# Patient Record
Sex: Female | Born: 2005
Health system: Southern US, Community
[De-identification: ages and names within clinical notes are randomized; demographics above are authoritative.]

## PROBLEM LIST (undated history)

## (undated) DIAGNOSIS — Z87898 Personal history of other specified conditions: Secondary | ICD-10-CM

## (undated) DIAGNOSIS — J45990 Exercise induced bronchospasm: Secondary | ICD-10-CM

## (undated) DIAGNOSIS — J353 Hypertrophy of tonsils with hypertrophy of adenoids: Secondary | ICD-10-CM

---
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
## 2023-01-17 DIAGNOSIS — Z113 Encounter for screening for infections with a predominantly sexual mode of transmission: Secondary | ICD-10-CM | POA: Diagnosis not present

## 2023-01-17 DIAGNOSIS — B3731 Acute candidiasis of vulva and vagina: Secondary | ICD-10-CM | POA: Diagnosis not present

## 2023-01-28 ENCOUNTER — Other Ambulatory Visit: Payer: Self-pay | Admitting: Family

## 2023-01-28 DIAGNOSIS — F902 Attention-deficit hyperactivity disorder, combined type: Secondary | ICD-10-CM

## 2023-01-30 ENCOUNTER — Other Ambulatory Visit: Payer: Self-pay

## 2023-01-30 DIAGNOSIS — Z114 Encounter for screening for human immunodeficiency virus [HIV]: Secondary | ICD-10-CM | POA: Diagnosis not present

## 2023-01-30 DIAGNOSIS — R1033 Periumbilical pain: Secondary | ICD-10-CM | POA: Diagnosis not present

## 2023-01-30 DIAGNOSIS — Z113 Encounter for screening for infections with a predominantly sexual mode of transmission: Secondary | ICD-10-CM | POA: Diagnosis not present

## 2023-01-30 DIAGNOSIS — R350 Frequency of micturition: Secondary | ICD-10-CM | POA: Diagnosis not present

## 2023-01-30 MED ORDER — CONCERTA 27 MG PO TBCR
27.0000 mg | EXTENDED_RELEASE_TABLET | ORAL | 0 refills | Status: DC
  Filled 2023-01-30: qty 30, 30d supply, fill #0

## 2023-02-03 ENCOUNTER — Other Ambulatory Visit: Payer: Self-pay

## 2023-02-17 DIAGNOSIS — N898 Other specified noninflammatory disorders of vagina: Secondary | ICD-10-CM | POA: Diagnosis not present

## 2023-03-03 ENCOUNTER — Ambulatory Visit (INDEPENDENT_AMBULATORY_CARE_PROVIDER_SITE_OTHER): Payer: Medicaid Other | Admitting: Family

## 2023-03-03 ENCOUNTER — Other Ambulatory Visit: Payer: Self-pay

## 2023-03-03 ENCOUNTER — Encounter: Payer: Self-pay | Admitting: Family

## 2023-03-03 ENCOUNTER — Other Ambulatory Visit (HOSPITAL_COMMUNITY)
Admission: RE | Admit: 2023-03-03 | Discharge: 2023-03-03 | Disposition: A | Discharge status: Home / Self Care | Payer: Medicaid Other | Source: Ambulatory Visit | Attending: Family | Admitting: Family

## 2023-03-03 VITALS — BP 115/68 | HR 77 | Ht 68.21 in | Wt 175.4 lb

## 2023-03-03 DIAGNOSIS — R300 Dysuria: Secondary | ICD-10-CM

## 2023-03-03 DIAGNOSIS — Z1331 Encounter for screening for depression: Secondary | ICD-10-CM

## 2023-03-03 DIAGNOSIS — Z113 Encounter for screening for infections with a predominantly sexual mode of transmission: Secondary | ICD-10-CM

## 2023-03-03 DIAGNOSIS — F902 Attention-deficit hyperactivity disorder, combined type: Secondary | ICD-10-CM | POA: Diagnosis not present

## 2023-03-03 DIAGNOSIS — N3000 Acute cystitis without hematuria: Secondary | ICD-10-CM | POA: Diagnosis not present

## 2023-03-03 DIAGNOSIS — Z3202 Encounter for pregnancy test, result negative: Secondary | ICD-10-CM | POA: Diagnosis not present

## 2023-03-03 LAB — POCT URINALYSIS DIPSTICK
Bilirubin, UA: NEGATIVE
Blood, UA: POSITIVE
Glucose, UA: NEGATIVE
Ketones, UA: NEGATIVE
Nitrite, UA: POSITIVE
Protein, UA: POSITIVE — AB
Spec Grav, UA: >=1.03 — AB (ref 1.010–1.025)
Urobilinogen, UA: NEGATIVE E.U./dL — AB
pH, UA: 5 (ref 5.0–8.0)

## 2023-03-03 LAB — POCT URINE PREGNANCY: Preg Test, Ur: NEGATIVE

## 2023-03-03 MED ORDER — NITROFURANTOIN MONOHYD MACRO 100 MG PO CAPS: 100.0000 mg | ORAL_CAPSULE | Freq: Two times a day (BID) | ORAL | 0 refills | Status: DC

## 2023-03-03 NOTE — Progress Notes (Signed)
History was provided by the patient.  Katrina Adams is a 17 y.o. female who is here for ADHD.   PCP confirmed? Yes.    Babs Sciara, MD   Plan from last visit:  1. ADHD (attention deficit hyperactivity disorder), combined type -continue with Concerta; recommended daily use for best performance  - CONCERTA 27 MG CR tablet; Take 1 tablet (27 mg total) by mouth every morning.  Dispense: 30 tablet; Refill: 0   2. Constipation, unspecified constipation type -clean out instructions reviewed  -no indication of acute abdomen today  -return precautions reviewed    3. Counseling for birth control, emergency contraceptive prescription -trial off Depo -discussed EC and condom use; not currently active  -return in 8 weeks to reassess cycle and contraceptive options or sooner if needed  HPI:    -has been having smelly urine, back pain x a month or so  -went to student health and was told she was OK  -no fever, no nausea or vomiting  -having urinary hesitancy, no burning but pressure when it comes out  -no vaginal discharge concerns, no pain with intercourse  -not taking Concerta since out of school; got tired of driving all the way to [facility]; has ADHD medication left  -feels like it works when she takes it  -LMP started 4 days ago, getting better now      03/03/2023   11:03 AM 09/22/2022    9:17 AM 09/22/2022    9:15 AM  PHQ-SADS Last 3 Score only  PHQ-15 Score 9 8   Total GAD-7 Score 4 5 5   PHQ Adolescent Score 7 8    ASRS /Completed on 05/ Part A:  4/6 Part B:  6/12   Patient Active Problem List   Diagnosis Date Noted   Amenorrhea due to Depo Provera 05/07/2022   Migraine without aura and without status migrainosus, not intractable 05/06/2022   ADHD (attention deficit hyperactivity disorder), combined type 08/24/2021   Closed displaced fracture of neck of right fifth metacarpal bone 09/28/2020   Elevated serum free T4 level 05/28/2020   Adjustment disorder with  mixed anxiety and depressed mood 05/28/2020   Sleep disturbance 05/28/2020   Idiopathic scoliosis 05/11/2016   Asthma, chronic 05/08/2013    Current Outpatient Medications on File Prior to Visit  Medication Sig Dispense Refill   CONCERTA 27 MG CR tablet Take 1 tablet (27 mg total) by mouth every morning. 30 tablet 0   metroNIDAZOLE (FLAGYL) 500 MG tablet Take 1 tablet (500 mg total) by mouth 2 (two) times daily. (Patient not taking: Reported on 12/13/2022) 14 tablet 0   polyethylene glycol powder (GLYCOLAX/MIRALAX) 17 GM/SCOOP powder Use as directed for clean out and daily maintenance dose 238 g 0   rizatriptan (MAXALT-MLT) 10 MG disintegrating tablet Take one tab po at onset of migraine. May repeat in 2 hours if needed. Max 2 per 24 hours. (Patient not taking: Reported on 12/13/2022) 10 tablet 0   No current facility-administered medications on file prior to visit.    Allergies  Allergen Reactions   Prednisone     Physical Exam:    Vitals:   03/03/23 1031  BP: 115/68  Pulse: 77  Weight: 175 lb 6.4 oz (79.6 kg)  Height: 5' 8.21" (1.733 m)   Wt Readings from Last 3 Encounters:  03/03/23 175 lb 6.4 oz (79.6 kg) (94 %, Z= 1.53)*  12/13/22 176 lb (79.8 kg) (94 %, Z= 1.56)*  09/22/22 174 lb (78.9 kg) (94 %,  Z= 1.53)*   * Growth percentiles are based on CDC (Girls, 2-20 Years) data.     Blood pressure %iles are not available for patients who are 18 years or older. No LMP recorded.  Physical Exam Constitutional:      General: She is not in acute distress.    Appearance: She is well-developed.  HENT:     Head: Normocephalic and atraumatic.  Eyes:     General: No scleral icterus.    Pupils: Pupils are equal, round, and reactive to light.  Neck:     Thyroid: No thyromegaly.  Cardiovascular:     Rate and Rhythm: Normal rate and regular rhythm.     Heart sounds: Normal heart sounds. No murmur heard. Pulmonary:     Effort: Pulmonary effort is normal.     Breath sounds:  Normal breath sounds.  Abdominal:     Palpations: Abdomen is soft.  Musculoskeletal:        General: Normal range of motion.     Cervical back: Normal range of motion and neck supple.     Comments: Negative CVAT bilateral    Lymphadenopathy:     Cervical: No cervical adenopathy.  Skin:    General: Skin is warm and dry.     Findings: No rash.  Neurological:     Mental Status: She is alert and oriented to person, place, and time.     Cranial Nerves: No cranial nerve deficit.  Psychiatric:        Behavior: Behavior normal.        Thought Content: Thought content normal.        Judgment: Judgment normal.      Assessment/Plan: 1. Dysuria 2. Acute cystitis without hematuria - POCT urinalysis dipstick -Macrobid Rx, culture pending; strict return precautions   3. ADHD (attention deficit hyperactivity disorder), combined type -does not need refill at this time; screening tools reviewed and stable   4. Negative pregnancy test - POCT urine pregnancy  5. Routine screening for STI (sexually transmitted infection - Urine cytology ancillary only

## 2023-03-05 LAB — URINE CULTURE
MICRO NUMBER:: 14971371
SPECIMEN QUALITY:: ADEQUATE

## 2023-03-06 ENCOUNTER — Other Ambulatory Visit: Payer: Self-pay | Admitting: Family

## 2023-03-06 ENCOUNTER — Other Ambulatory Visit: Payer: Self-pay

## 2023-03-06 MED ORDER — SULFAMETHOXAZOLE-TRIMETHOPRIM 800-160 MG PO TABS
1.0000 | ORAL_TABLET | Freq: Two times a day (BID) | ORAL | 0 refills | Status: DC
  Filled 2023-03-06: qty 6, 3d supply, fill #0

## 2023-03-07 LAB — URINE CYTOLOGY ANCILLARY ONLY
Bacterial Vaginitis-Urine: NEGATIVE
Candida Urine: NEGATIVE
Chlamydia: NEGATIVE
Comment: NEGATIVE
Comment: NEGATIVE
Comment: NORMAL
Neisseria Gonorrhea: NEGATIVE
Trichomonas: NEGATIVE

## 2023-06-02 ENCOUNTER — Ambulatory Visit: Payer: Medicaid Other | Admitting: Nurse Practitioner

## 2023-06-02 VITALS — BP 118/64 | HR 104 | Ht 68.22 in | Wt 168.8 lb

## 2023-06-02 DIAGNOSIS — R7989 Other specified abnormal findings of blood chemistry: Secondary | ICD-10-CM | POA: Diagnosis not present

## 2023-06-02 DIAGNOSIS — K219 Gastro-esophageal reflux disease without esophagitis: Secondary | ICD-10-CM | POA: Diagnosis not present

## 2023-06-02 DIAGNOSIS — F902 Attention-deficit hyperactivity disorder, combined type: Secondary | ICD-10-CM | POA: Diagnosis not present

## 2023-06-02 DIAGNOSIS — K5909 Other constipation: Secondary | ICD-10-CM | POA: Diagnosis not present

## 2023-06-02 MED ORDER — PANTOPRAZOLE SODIUM 40 MG PO TBEC: 40.0000 mg | DELAYED_RELEASE_TABLET | Freq: Every day | ORAL | 0 refills | Status: DC

## 2023-06-02 MED ORDER — LINACLOTIDE 145 MCG PO CAPS: ORAL_CAPSULE | ORAL | 0 refills | Status: DC

## 2023-06-02 MED ORDER — CONCERTA 27 MG PO TBCR
27.0000 mg | EXTENDED_RELEASE_TABLET | ORAL | 0 refills | Status: DC
Start: 2023-06-02 — End: 2023-07-07

## 2023-06-02 NOTE — Progress Notes (Unsigned)
Subjective:    Patient ID: Katrina Adams, female    DOB: 01-03-2006, 17 y.o.   MRN: [id number]  HPI Patient arrives today with issues with acid reflux, nausea, bloating, and constipation. Symptoms have been going on for a month. Patient has physical swelling in the throat with reflux.Patient needs refill of Concerta if possible.  This patient has adult ADD. Takes medication responsibly. Medication does help the patient focus in be more functional. Patient relates that they are or not abusing the medication or misusing the medication. The patient understands that if they're having any negative side effects such as elevated high blood pressure severe headaches they would need stop the medication follow-up immediately. They also understand that the prescriptions are to last for 3 months then the patient will need to follow-up before having further prescriptions. Would like to restart her ADD medicine to help her focus for classes.  Has been off her Concerta for a while.  Denies any adverse effects that she can remember at the 27 mg dose. Complaints of acid reflux over the past month.  Worse at nighttime.  Her last meal is around 6-7 o'clock and goes to bed around 11.  Eats mostly a vegetarian diet.  Symptoms unassociated with any particular foods.  Has a strong family history of reflux disease.  No caffeine intake.  No alcohol tobacco or vaping.  No NSAID use.  Some upper mid abdominal pain at times.  Occasional burning pain into the chest and throat. Has a long-term history of constipation, has a BM every 4 to 5 days and usually has to take medication such as a laxative for this to occur.  Has tried multiple products including MiraLAX with minimal improvement.  Denies any dark or bloody stools.  No change in the color. Requesting to have her thyroid rechecked, states her father and maternal great-grandmother have thyroid disease. No fevers.  Nausea but no vomiting.  Decreased appetite lately.     06/02/2023    2:46 PM  Depression screen PHQ 2/9  Decreased Interest 1  Down, Depressed, Hopeless 0  PHQ - 2 Score 1  Altered sleeping 3  Tired, decreased energy 1  Change in appetite 2  Feeling bad or failure about yourself  0  Trouble concentrating 0  Moving slowly or fidgety/restless 0  Suicidal thoughts 0  PHQ-9 Score 7  Difficult doing work/chores Very difficult      06/02/2023    2:47 PM 03/03/2023   11:03 AM 09/22/2022    9:17 AM 09/22/2022    9:15 AM  GAD 7 : Generalized Anxiety Score  Nervous, Anxious, on Edge 0 1 0 0  Control/stop worrying 0 0 0 0  Worry too much - different things 0 1 2 2   Trouble relaxing 1 1 1 1   Restless 0 1 1 1   Easily annoyed or irritable 2 0 1 1  Afraid - awful might happen 0 0 0 0  Total GAD 7 Score 3 4 5 5   Anxiety Difficulty Somewhat difficult   Not difficult at all        Review of Systems  Constitutional:  Positive for fatigue.  HENT:  Negative for sore throat and trouble swallowing.   Respiratory:  Negative for cough, chest tightness, shortness of breath and wheezing.   Cardiovascular:  Negative for chest pain and palpitations.  Gastrointestinal:  Positive for abdominal pain, constipation and nausea. Negative for blood in stool, diarrhea and vomiting.  Psychiatric/Behavioral:  Positive for decreased concentration  and sleep disturbance. Negative for suicidal ideas. The patient is nervous/anxious.        Objective:   Physical Exam NAD.  Alert, oriented.  Calm cheerful affect.  Making good eye contact.  Speech clear.  Thoughts logical coherent and relevant.  Thyroid nontender to palpation, no mass or goiter noted.  Lungs clear.  Heart regular rate rhythm.  Abdomen soft nondistended with distinct epigastric area tenderness.  No rebound or guarding.  No obvious masses. Today's Vitals   06/02/23 1453  BP: 118/64  Pulse: (!) 104  SpO2: 97%  Weight: 168 lb 12.8 oz (76.6 kg)  Height: 5' 8.22" (1.733 m)   Body mass index is 25.5  kg/m.  7 pound weight loss since May.      Assessment & Plan:   Problem List Items Addressed This Visit       Other   ADHD (attention deficit hyperactivity disorder), combined type - Primary   Relevant Medications   CONCERTA 27 MG CR tablet   Elevated serum free T4 level   Other Visit Diagnoses     Abnormal thyroid blood test       Relevant Orders   TSH (Completed)   T4, free (Completed)   Chronic constipation       Gastroesophageal reflux disease without esophagitis       Relevant Medications   pantoprazole (PROTONIX) 40 MG tablet   linaclotide (LINZESS) 145 MCG CAPS capsule      Repeat thyroid labs. Meds ordered this encounter  Medications   CONCERTA 27 MG CR tablet    Sig: Take 1 tablet (27 mg total) by mouth every morning.    Dispense:  30 tablet    Refill:  0    Order Specific Question:   Supervising Provider    Answer:   Lilyan Punt A [[id number]]   pantoprazole (PROTONIX) 40 MG tablet    Sig: Take 1 tablet (40 mg total) by mouth daily. For acid reflux    Dispense:  30 tablet    Refill:  0    Order Specific Question:   Supervising Provider    Answer:   Lilyan Punt A [[id number]]   linaclotide (LINZESS) 145 MCG CAPS capsule    Sig: Take one capsule po daily for constipation    Dispense:  30 capsule    Refill:  0    Order Specific Question:   Supervising Provider    Answer:   Babs Sciara [phone]   Given written and verbal information on lifestyle factors and diet that affect reflux.  Start pantoprazole daily until next visit.  Trial of Linzess for chronic constipation.  Discussed sources of protein since patient is mainly eating vegetarian at this time. Restart Concerta 27 mg daily. Warning signs reviewed.  Follow-up in 1 month for recheck on GERD, constipation and use of Concerta.  Call back sooner if any problems.

## 2023-06-02 NOTE — Patient Instructions (Signed)
Food Choices for Gastroesophageal Reflux Disease, Adult When you have gastroesophageal reflux disease (GERD), the foods you eat and your eating habits are very important. Choosing the right foods can help ease the discomfort of GERD. Consider working with a dietitian to help you make healthy food choices. What are tips for following this plan? Reading food labels Look for foods that are low in saturated fat. Foods that have less than 5% of daily value (DV) of fat and 0 g of trans fats may help with your symptoms. Cooking Cook foods using methods other than frying. This may include baking, steaming, grilling, or broiling. These are all methods that do not need a lot of fat for cooking. To add flavor, try to use herbs that are low in spice and acidity. Meal planning  Choose healthy foods that are low in fat, such as fruits, vegetables, whole grains, low-fat dairy products, lean meats, fish, and poultry. Eat frequent, small meals instead of three large meals each day. Eat your meals slowly, in a relaxed setting. Avoid bending over or lying down until 2-3 hours after eating. Limit high-fat foods such as fatty meats or fried foods. Limit your intake of fatty foods, such as oils, butter, and shortening. Avoid the following as told by your health care provider: Foods that cause symptoms. These may be different for different people. Keep a food diary to keep track of foods that cause symptoms. Alcohol. Drinking large amounts of liquid with meals. Eating meals during the 2-3 hours before bed. Lifestyle Maintain a healthy weight. Ask your health care provider what weight is healthy for you. If you need to lose weight, work with your health care provider to do so safely. Exercise for at least 30 minutes on 5 or more days each week, or as told by your health care provider. Avoid wearing clothes that fit tightly around your waist and chest. Do not use any products that contain nicotine or tobacco. These  products include cigarettes, chewing tobacco, and vaping devices, such as e-cigarettes. If you need help quitting, ask your health care provider. Sleep with the head of your bed raised. Use a wedge under the mattress or blocks under the bed frame to raise the head of the bed. Chew sugar-free gum after mealtimes. What foods should I eat?  Eat a healthy, well-balanced diet of fruits, vegetables, whole grains, low-fat dairy products, lean meats, fish, and poultry. Each person is different. Foods that may trigger symptoms in one person may not trigger any symptoms in another person. Work with your health care provider to identify foods that are safe for you. The items listed above may not be a complete list of recommended foods and beverages. Contact a dietitian for more information. What foods should I avoid? Limiting some of these foods may help manage the symptoms of GERD. Everyone is different. Consult a dietitian or your health care provider to help you identify the exact foods to avoid, if any. Fruits Any fruits prepared with added fat. Any fruits that cause symptoms. For some people this may include citrus fruits, such as oranges, grapefruit, pineapple, and lemons. Vegetables Deep-fried vegetables. French fries. Any vegetables prepared with added fat. Any vegetables that cause symptoms. For some people, this may include tomatoes and tomato products, chili peppers, onions and garlic, and horseradish. Grains Pastries or quick breads with added fat. Meats and other proteins High-fat meats, such as fatty beef or pork, hot dogs, ribs, ham, sausage, salami, and bacon. Fried meat or protein, including   fried fish and fried chicken. Nuts and nut butters, in large amounts. Dairy Whole milk and chocolate milk. Sour cream. Cream. Ice cream. Cream cheese. Milkshakes. Fats and oils Butter. Margarine. Shortening. Ghee. Beverages Coffee and tea, with or without caffeine. Carbonated beverages. Sodas. Energy  drinks. Fruit juice made with acidic fruits, such as orange or grapefruit. Tomato juice. Alcoholic drinks. Sweets and desserts Chocolate and cocoa. Donuts. Seasonings and condiments Pepper. Peppermint and spearmint. Added salt. Any condiments, herbs, or seasonings that cause symptoms. For some people, this may include curry, hot sauce, or vinegar-based salad dressings. The items listed above may not be a complete list of foods and beverages to avoid. Contact a dietitian for more information. Questions to ask your health care provider Diet and lifestyle changes are usually the first steps that are taken to manage symptoms of GERD. If diet and lifestyle changes do not improve your symptoms, talk with your health care provider about taking medicines. Where to find more information [employer] G[employer]intestinal [employer]: aboutgerd.org Summary When you have gastroesophageal reflux disease (GERD), food and lifestyle choices may be very helpful in easing the discomfort of GERD. Eat frequent, small meals instead of three large meals each day. Eat your meals slowly, in a relaxed setting. Avoid bending over or lying down until 2-3 hours after eating. Limit high-fat foods such as fatty meats or fried foods. This information is not intended to replace advice given to you by your health care provider. Make sure you discuss any questions you have with your health care provider. Document Revised: 04/13/2020 Document Reviewed: 04/13/2020 Elsevier Patient Education  2024 [employer] Inc.  

## 2023-06-03 ENCOUNTER — Encounter: Payer: Self-pay | Admitting: Nurse Practitioner

## 2023-06-03 LAB — TSH: TSH: 1.18 u[IU]/mL (ref 0.450–4.500)

## 2023-06-03 LAB — T4, FREE: Free T4: 1.82 ng/dL — ABNORMAL HIGH (ref 0.93–1.60)

## 2023-06-16 ENCOUNTER — Encounter (HOSPITAL_COMMUNITY): Payer: Self-pay

## 2023-06-16 ENCOUNTER — Ambulatory Visit (HOSPITAL_COMMUNITY)
Admission: EM | Admit: 2023-06-16 | Discharge: 2023-06-16 | Disposition: A | Discharge status: Home / Self Care | Payer: Medicaid Other | Attending: Emergency Medicine | Admitting: Emergency Medicine

## 2023-06-16 DIAGNOSIS — Z1152 Encounter for screening for COVID-19: Secondary | ICD-10-CM | POA: Insufficient documentation

## 2023-06-16 DIAGNOSIS — R5383 Other fatigue: Secondary | ICD-10-CM

## 2023-06-16 DIAGNOSIS — K2100 Gastro-esophageal reflux disease with esophagitis, without bleeding: Secondary | ICD-10-CM

## 2023-06-16 DIAGNOSIS — J029 Acute pharyngitis, unspecified: Secondary | ICD-10-CM | POA: Diagnosis not present

## 2023-06-16 LAB — COMPREHENSIVE METABOLIC PANEL
ALT: 13 U/L (ref 0–44)
AST: 14 U/L — ABNORMAL LOW (ref 15–41)
Albumin: 3.9 g/dL (ref 3.5–5.0)
Alkaline Phosphatase: 54 U/L (ref 38–126)
Anion gap: 9 (ref 5–15)
BUN: 13 mg/dL (ref 6–20)
CO2: 26 mmol/L (ref 22–32)
Calcium: 9.4 mg/dL (ref 8.9–10.3)
Chloride: 104 mmol/L (ref 98–111)
Creatinine, Ser: 0.89 mg/dL (ref 0.44–1.00)
GFR, Estimated: >60 mL/min (ref >60)
Glucose, Bld: 127 mg/dL — ABNORMAL HIGH (ref 70–99)
Potassium: 3.5 mmol/L (ref 3.5–5.1)
Sodium: 139 mmol/L (ref 135–145)
Total Bilirubin: 0.2 mg/dL — ABNORMAL LOW (ref 0.3–1.2)
Total Protein: 7.1 g/dL (ref 6.5–8.1)

## 2023-06-16 LAB — CBC WITH DIFFERENTIAL/PLATELET
Abs Immature Granulocytes: 0.01 10*3/uL (ref 0.00–0.07)
Basophils Absolute: 0 10*3/uL (ref 0.0–0.1)
Basophils Relative: 1 %
Eosinophils Absolute: 0.1 10*3/uL (ref 0.0–0.5)
Eosinophils Relative: 1 %
HCT: 36 % (ref 36.0–46.0)
Hemoglobin: 11.8 g/dL — ABNORMAL LOW (ref 12.0–15.0)
Immature Granulocytes: 0 %
Lymphocytes Relative: 48 %
Lymphs Abs: 2.6 10*3/uL (ref 0.7–4.0)
MCH: 27.5 pg (ref 26.0–34.0)
MCHC: 32.8 g/dL (ref 30.0–36.0)
MCV: 83.9 fL (ref 80.0–100.0)
Monocytes Absolute: 0.4 10*3/uL (ref 0.1–1.0)
Monocytes Relative: 7 %
Neutro Abs: 2.3 10*3/uL (ref 1.7–7.7)
Neutrophils Relative %: 43 %
Platelets: 269 10*3/uL (ref 150–400)
RBC: 4.29 MIL/uL (ref 3.87–5.11)
RDW: 13.8 % (ref 11.5–15.5)
WBC: 5.4 10*3/uL (ref 4.0–10.5)
nRBC: 0 % (ref 0.0–0.2)

## 2023-06-16 MED ORDER — ACETAMINOPHEN 500 MG PO TABS: 500.0000 mg | ORAL_TABLET | Freq: Four times a day (QID) | ORAL | 0 refills | Status: DC | PRN

## 2023-06-16 MED ORDER — OMEPRAZOLE 20 MG PO CPDR: 20.0000 mg | DELAYED_RELEASE_CAPSULE | Freq: Every day | ORAL | 0 refills | Status: DC

## 2023-06-16 MED ORDER — IBUPROFEN 800 MG PO TABS: 800.0000 mg | ORAL_TABLET | Freq: Three times a day (TID) | ORAL | 0 refills | Status: DC

## 2023-06-16 NOTE — Discharge Instructions (Addendum)
We have swabbed you for COVID-19 and staff will contact if you are positive.  For your headache, sore throat and fatigue you can alternate between 800 mg of ibuprofen and 500 mg of Tylenol every 4-6 hours.  We have swabbed your throat for STIs and we will also contact you if anything results is positive, requiring treatment.  Start the omeprazole once daily for your acid reflux, you can stop the Protonix.  You should take this for 2 months.  Please follow-up with your primary care provider if you do not experience any improvement in your acid reflux.  Please ensure you are eating small meals and staying upright after eating.  Return to clinic for new or urgent symptoms.

## 2023-06-16 NOTE — ED Triage Notes (Signed)
Pt presents to the office for fatigue,chills and body aches. Pt reports nausea that started today. Pt took her protonix with no relief.

## 2023-06-16 NOTE — ED Provider Notes (Signed)
[facility]    CSN: [id number] Arrival date & time: 06/16/23  1902      History   Chief Complaint Chief Complaint  Patient presents with   Fatigue   Chills   Nasal Congestion    HPI Katrina Adams is a 17 y.o. female.   Patient presents to clinic for sore throat, fatigue, nausea, headache and bodyaches.  Nausea started today.  She is concerned that she might have sexually transmitted infections of her throat as she recently gave oral sex.  She denies any known exposure to sexually transmitted infections.  Has not tried any medications for her sore throat.  She has also been having acid reflux.  Saw her primary care provider 2 weeks ago who prescribed her Protonix, has been taking this without much relief.  She has been having some tongue tingling.  Denies any anemia or vitamin deficiencies.  Reports she drinks mostly water, eat small frequent meals, and avoids heavily processed or fatty foods.  She denies any fever, abdominal pain, cough, nasal congestion or recent sick contacts.    The history is provided by the patient and medical records.    Past Medical History:  Diagnosis Date   Exercise-induced asthma    no inhaler use in 2 yr.   History of febrile seizure    age 55   Tonsillar and adenoid hypertrophy 10/2017   snores during sleep, mother denies apnea    Patient Active Problem List   Diagnosis Date Noted   Amenorrhea due to Depo Provera 05/07/2022   Migraine without aura and without status migrainosus, not intractable 05/06/2022   ADHD (attention deficit hyperactivity disorder), combined type 08/24/2021   Closed displaced fracture of neck of right fifth metacarpal bone 09/28/2020   Elevated serum free T4 level 05/28/2020   Adjustment disorder with mixed anxiety and depressed mood 05/28/2020   Sleep disturbance 05/28/2020   Idiopathic scoliosis 05/11/2016   Asthma, chronic 05/08/2013    Past Surgical History:  Procedure Laterality Date   CLOSED  REDUCTION FINGER WITH PERCUTANEOUS PINNING Right 09/28/2020   Procedure: RIGHT CLOSED REDUCTION FINGER WITH PERCUTANEOUS PINNING FIFTH METACARPAL;  Surgeon: Teryl Lucy, MD;  Location: [facility];  Service: Orthopedics;  Laterality: Right;   TONSILLECTOMY AND ADENOIDECTOMY Bilateral 11/14/2017   Procedure: TONSILLECTOMY AND ADENOIDECTOMY;  Surgeon: Newman Pies, MD;  Location: [facility];  Service: ENT;  Laterality: Bilateral;    OB History   No obstetric history on file.      Home Medications    Prior to Admission medications   Medication Sig Start Date End Date Taking? Authorizing Provider  acetaminophen (TYLENOL) 500 MG tablet Take 1 tablet (500 mg total) by mouth every 6 (six) hours as needed. 06/16/23  Yes Rinaldo Ratel, [place] N, FNP  CONCERTA 27 MG CR tablet Take 1 tablet (27 mg total) by mouth every morning. 06/02/23  Yes Campbell Riches, NP  ibuprofen (ADVIL) 800 MG tablet Take 1 tablet (800 mg total) by mouth 3 (three) times daily. 06/16/23  Yes Rinaldo Ratel, [place] N, FNP  linaclotide [facility]) 145 MCG CAPS capsule Take one capsule po daily for constipation 06/02/23  Yes Sherie Don C, NP  omeprazole (PRILOSEC) 20 MG capsule Take 1 capsule (20 mg total) by mouth daily. 06/16/23 08/15/23 Yes Rinaldo Ratel, [place] N, FNP  pantoprazole (PROTONIX) 40 MG tablet Take 1 tablet (40 mg total) by mouth daily. For acid reflux 06/02/23  Yes Campbell Riches, NP    Family History  Family History  Problem Relation Age of Onset   Hypertension Mother    Diabetes Maternal Grandmother     Social History Social History   Tobacco Use   Smoking status: Never    Passive exposure: Never   Smokeless tobacco: Never  Vaping Use   Vaping status: Never Used  Substance Use Topics   Alcohol use: No   Drug use: No     Allergies   Prednisone   Review of Systems Review of Systems  Constitutional:  Positive for fatigue.  HENT:  Positive for sore throat.    Respiratory:  Negative for cough and shortness of breath.   Cardiovascular:  Negative for chest pain.  Gastrointestinal:  Positive for nausea. Negative for abdominal pain and vomiting.  Genitourinary:  Negative for dysuria.  Musculoskeletal:  Positive for arthralgias.  Neurological:  Positive for headaches.     Physical Exam Triage Vital Signs ED Triage Vitals [06/16/23 1926]  Encounter Vitals Group     BP 139/79     Systolic BP Percentile      Diastolic BP Percentile      Pulse Rate 97     Resp 16     Temp 98.6 F (37 C)     Temp Source Oral     SpO2 97 %     Weight      Height      Head Circumference      Peak Flow      Pain Score      Pain Loc      Pain Education      Exclude from Growth Chart    No data found.  Updated Vital Signs BP 139/79 (BP Location: Left Arm)   Pulse 97   Temp 98.6 F (37 C) (Oral)   Resp 16   LMP 06/06/2023   SpO2 97%   Visual Acuity Right Eye Distance:   Left Eye Distance:   Bilateral Distance:    Right Eye Near:   Left Eye Near:    Bilateral Near:     Physical Exam Vitals and nursing note reviewed.  Constitutional:      Appearance: Normal appearance.  HENT:     Head: Normocephalic and atraumatic.     Right Ear: External ear normal.     Left Ear: External ear normal.     Nose: Nose normal.     Mouth/Throat:     Mouth: Mucous membranes are moist.     Pharynx: Posterior oropharyngeal erythema present.  Eyes:     Conjunctiva/sclera: Conjunctivae normal.  Cardiovascular:     Rate and Rhythm: Normal rate and regular rhythm.     Pulses: Normal pulses.     Heart sounds: Normal heart sounds.  Pulmonary:     Effort: Pulmonary effort is normal. No respiratory distress.     Breath sounds: Normal breath sounds.  Musculoskeletal:        General: Normal range of motion.     Cervical back: Normal range of motion.  Skin:    General: Skin is warm and dry.  Neurological:     General: No focal deficit present.     Mental  Status: She is alert and oriented to person, place, and time.  Psychiatric:        Mood and Affect: Mood normal.        Behavior: Behavior normal.      UC Treatments / Results  Labs (all labs ordered are listed, but only abnormal results are  displayed) Labs Reviewed  SARS CORONAVIRUS 2 (TAT 6-24 HRS)  CBC WITH DIFFERENTIAL/PLATELET  COMPREHENSIVE METABOLIC PANEL  CYTOLOGY, (ORAL, ANAL, URETHRAL) ANCILLARY ONLY    EKG   Radiology No results found.  Procedures Procedures (including critical care time)  Medications Ordered in UC Medications - No data to display  Initial Impression / Assessment and Plan / UC Course  I have reviewed the triage vital signs and the nursing notes.  Pertinent labs & imaging results that were available during my care of the patient were reviewed by me and considered in my medical decision making (see chart for details).  Vitals and triage reviewed, patient is hemodynamically stable.  Symptoms of fatigue, body aches, headaches and sore throat are consistent with a viral illness.  Uvula midline, tonsils without exudate.  Afebrile.  COVID-19 testing obtained.  Patient requested STI screening of her throat, obtained.  Will check CBC and CMP due to tongue tingling for electrolyte or metabolic abnormalities.  Will trial PPI once daily for GERD and dietary modifications.  Plan of care, follow-up care and return precautions given, no questions at this time.     Final Clinical Impressions(s) / UC Diagnoses   Final diagnoses:  Acute pharyngitis, unspecified etiology  Other fatigue  Gastroesophageal reflux disease with esophagitis without hemorrhage     Discharge Instructions      We have swabbed you for COVID-19 and staff will contact if you are positive.  For your headache, sore throat and fatigue you can alternate between 800 mg of ibuprofen and 500 mg of Tylenol every 4-6 hours.  We have swabbed your throat for STIs and we will also contact you if  anything results is positive, requiring treatment.  Start the omeprazole once daily for your acid reflux, you can stop the Protonix.  You should take this for 2 months.  Please follow-up with your primary care provider if you do not experience any improvement in your acid reflux.  Please ensure you are eating small meals and staying upright after eating.  Return to clinic for new or urgent symptoms.      ED Prescriptions     Medication Sig Dispense Auth. Provider   omeprazole (PRILOSEC) 20 MG capsule Take 1 capsule (20 mg total) by mouth daily. 60 capsule Rinaldo Ratel, [place] N, [place]   acetaminophen (TYLENOL) 500 MG tablet Take 1 tablet (500 mg total) by mouth every 6 (six) hours as needed. 30 tablet Rinaldo Ratel, [place] N, [place]   ibuprofen (ADVIL) 800 MG tablet Take 1 tablet (800 mg total) by mouth 3 (three) times daily. 21 tablet Mattalynn Crandle, [place] N, [place]      PDMP not reviewed this encounter.   Abu Heavin, [place] N, [place] 06/16/23 2001

## 2023-06-17 LAB — SARS CORONAVIRUS 2 (TAT 6-24 HRS): SARS Coronavirus 2: NEGATIVE

## 2023-06-22 LAB — CYTOLOGY, (ORAL, ANAL, URETHRAL) ANCILLARY ONLY
Chlamydia: NEGATIVE
Comment: NEGATIVE
Comment: NEGATIVE
Comment: NORMAL
Neisseria Gonorrhea: NEGATIVE
Trichomonas: NEGATIVE

## 2023-07-07 ENCOUNTER — Encounter: Payer: Self-pay | Admitting: Nurse Practitioner

## 2023-07-07 ENCOUNTER — Ambulatory Visit: Payer: Medicaid Other | Admitting: Nurse Practitioner

## 2023-07-07 VITALS — BP 109/74 | HR 62 | Temp 98.4°F | Ht 68.0 in | Wt 170.0 lb

## 2023-07-07 DIAGNOSIS — K5909 Other constipation: Secondary | ICD-10-CM

## 2023-07-07 DIAGNOSIS — K219 Gastro-esophageal reflux disease without esophagitis: Secondary | ICD-10-CM

## 2023-07-07 DIAGNOSIS — F902 Attention-deficit hyperactivity disorder, combined type: Secondary | ICD-10-CM

## 2023-07-07 MED ORDER — OMEPRAZOLE 20 MG PO CPDR: 20.0000 mg | DELAYED_RELEASE_CAPSULE | Freq: Every day | ORAL | 0 refills | Status: DC

## 2023-07-07 MED ORDER — METHYLPHENIDATE HCL ER (OSM) 27 MG PO TBCR: 27.0000 mg | EXTENDED_RELEASE_TABLET | ORAL | 0 refills | Status: DC

## 2023-07-07 MED ORDER — CONCERTA 27 MG PO TBCR
27.0000 mg | EXTENDED_RELEASE_TABLET | ORAL | 0 refills | Status: DC
Start: 2023-07-07 — End: 2023-10-17

## 2023-07-07 NOTE — Progress Notes (Unsigned)
Subjective:    Patient ID: Katrina Adams, female    DOB: January 26, 2006, 17 y.o.   MRN: [id number]  HPI This patient has adult ADD. Takes medication responsibly. Medication does help the patient focus in be more functional. Patient relates that they are or not abusing the medication or misusing the medication. The patient understands that if they're having any negative side effects such as elevated high blood pressure severe headaches they would need stop the medication follow-up immediately. They also understand that the prescriptions are to last for 3 months then the patient will need to follow-up before having further prescriptions.  Patient compliance yes  Does medication help patient function /attention better  yes  Side effects: no headaches, palpitations or insomnia. GERD well-controlled with omeprazole and chronic constipation controlled with Linzess.    07/07/2023    3:15 PM  Depression screen PHQ 2/9  Decreased Interest 2  Down, Depressed, Hopeless 0  PHQ - 2 Score 2  Altered sleeping 0  Tired, decreased energy 3  Change in appetite 0  Feeling bad or failure about yourself  0  Trouble concentrating 0  Moving slowly or fidgety/restless 0  Suicidal thoughts 0  PHQ-9 Score 5  Difficult doing work/chores Not difficult at all      07/07/2023    3:15 PM 06/02/2023    2:47 PM 03/03/2023   11:03 AM 09/22/2022    9:17 AM  GAD 7 : Generalized Anxiety Score  Nervous, Anxious, on Edge 0 0 1 0  Control/stop worrying 0 0 0 0  Worry too much - different things 1 0 1 2  Trouble relaxing 0 1 1 1   Restless 0 0 1 1  Easily annoyed or irritable 0 2 0 1  Afraid - awful might happen 0 0 0 0  Total GAD 7 Score 1 3 4 5   Anxiety Difficulty Not difficult at all Somewhat difficult       Review of Systems  Constitutional:  Positive for fatigue.       Relates fatigue to busy lifestyle including school.  Respiratory:  Negative for cough, chest tightness and shortness of breath.    Cardiovascular:  Negative for chest pain and palpitations.  Gastrointestinal:  Positive for constipation. Negative for abdominal pain, nausea and vomiting.  Psychiatric/Behavioral:  Negative for sleep disturbance.        Objective:   Physical Exam NAD.  Alert, oriented.  Calm cheerful affect.  Lungs clear.  Heart regular rate rhythm.  Soft nondistended nontender. Today's Vitals   07/07/23 1506  BP: 109/74  Pulse: 62  Temp: 98.4 F (36.9 C)  SpO2: 98%  Weight: 170 lb (77.1 kg)  Height: 5\' 8"  (1.727 m)   Body mass index is 25.85 kg/m.        Assessment & Plan:   Problem List Items Addressed This Visit       Digestive   Gastroesophageal reflux disease without esophagitis - Primary   Relevant Medications   omeprazole (PRILOSEC) 20 MG capsule     Other   ADHD (attention deficit hyperactivity disorder), combined type   Relevant Medications   CONCERTA 27 MG CR tablet   Meds ordered this encounter  Medications   omeprazole (PRILOSEC) 20 MG capsule    Sig: Take 1 capsule (20 mg total) by mouth daily. Prn acid reflux    Dispense:  90 capsule    Refill:  0    Order Specific Question:   Supervising Provider    Answer:  LUKING, SCOTT A [[id number]]   CONCERTA 27 MG CR tablet    Sig: Take 1 tablet (27 mg total) by mouth every morning.    Dispense:  30 tablet    Refill:  0    Order Specific Question:   Supervising Provider    Answer:   Lilyan Punt A [[id number]]   methylphenidate (CONCERTA) 27 MG PO CR tablet    Sig: Take 1 tablet (27 mg total) by mouth every morning.    Dispense:  30 tablet    Refill:  0    Dispense name brand Concerta; may fill 30 days from 07/07/23    Order Specific Question:   Supervising Provider    Answer:   Lilyan Punt A [[id number]]   methylphenidate (CONCERTA) 27 MG PO CR tablet    Sig: Take 1 tablet (27 mg total) by mouth every morning.    Dispense:  30 tablet    Refill:  0    Dispense name brand Concerta; may fill 60 days from 07/07/23    Order  Specific Question:   Supervising Provider    Answer:   Lilyan Punt A [[id number]]   Continue current medications as directed. Defers flu vaccine at this time. Return in about 3 months (around 10/06/2023).

## 2023-07-09 ENCOUNTER — Encounter: Payer: Self-pay | Admitting: Nurse Practitioner

## 2023-07-09 DIAGNOSIS — K5909 Other constipation: Secondary | ICD-10-CM | POA: Insufficient documentation

## 2023-07-17 DIAGNOSIS — N898 Other specified noninflammatory disorders of vagina: Secondary | ICD-10-CM | POA: Diagnosis not present

## 2023-07-17 DIAGNOSIS — Z114 Encounter for screening for human immunodeficiency virus [HIV]: Secondary | ICD-10-CM | POA: Diagnosis not present

## 2023-07-17 DIAGNOSIS — Z113 Encounter for screening for infections with a predominantly sexual mode of transmission: Secondary | ICD-10-CM | POA: Diagnosis not present

## 2023-08-11 ENCOUNTER — Ambulatory Visit (HOSPITAL_COMMUNITY): Payer: Medicaid Other

## 2023-08-11 ENCOUNTER — Encounter (HOSPITAL_COMMUNITY): Payer: Self-pay

## 2023-08-11 ENCOUNTER — Ambulatory Visit (HOSPITAL_COMMUNITY)
Admission: EM | Admit: 2023-08-11 | Discharge: 2023-08-11 | Disposition: A | Discharge status: Home / Self Care | Payer: Medicaid Other | Attending: Family Medicine | Admitting: Family Medicine

## 2023-08-11 DIAGNOSIS — R5383 Other fatigue: Secondary | ICD-10-CM | POA: Diagnosis not present

## 2023-08-11 DIAGNOSIS — R059 Cough, unspecified: Secondary | ICD-10-CM | POA: Diagnosis not present

## 2023-08-11 DIAGNOSIS — J069 Acute upper respiratory infection, unspecified: Secondary | ICD-10-CM

## 2023-08-11 DIAGNOSIS — Z7251 High risk heterosexual behavior: Secondary | ICD-10-CM

## 2023-08-11 LAB — POCT URINE PREGNANCY: Preg Test, Ur: NEGATIVE

## 2023-08-11 MED ORDER — BENZONATATE 100 MG PO CAPS: 100.0000 mg | ORAL_CAPSULE | Freq: Three times a day (TID) | ORAL | 0 refills | Status: DC | PRN

## 2023-08-11 MED ORDER — LEVONORGESTREL 1.5 MG PO TABS: 1.5000 mg | ORAL_TABLET | Freq: Once | ORAL | 0 refills | Status: AC

## 2023-08-11 MED ORDER — ONDANSETRON 4 MG PO TBDP: 4.0000 mg | ORAL_TABLET | Freq: Three times a day (TID) | ORAL | 0 refills | Status: DC | PRN

## 2023-08-11 NOTE — Discharge Instructions (Signed)
The pregnancy test was negative   by my review your chest x-ray is normal. The radiologist will also read your x-ray, and if their interpretation differs significantly from mine, we will call you.  Take benzonatate 100 mg, 1 tab every 8 hours as needed for cough.  Ondansetron dissolved in the mouth every 8 hours as needed for nausea or vomiting. Clear liquids(water, gatorade/pedialyte, ginger ale/sprite, chicken broth/soup) and bland things(crackers/toast, rice, potato, bananas) to eat. Avoid acidic foods like lemon/lime/orange/tomato, and avoid greasy/spicy foods.  11 norgestrel (Plan B) 1.5 mg tablet--take 1 tablet once as soon as you get the prescription.  You need to take it within 72 hours of last intercourse for it to be effective.  Please talk to your primary care or to the clinic at [facility] to consider birth control

## 2023-08-11 NOTE — ED Triage Notes (Addendum)
Pt presents with fatigue, generalized body aches, nausea, headache, and cough since Monday 08/07/23. Pt reports she has taken cough drops and Nyquil/Dayquil to help manage symptoms however reports little to no improvement. Last dose of medications reported to be yesterday morning. Pt denies fevers at home.

## 2023-08-11 NOTE — ED Provider Notes (Signed)
[facility]    CSN: [id number] Arrival date & time: 08/11/23  1156      History   Chief Complaint Chief Complaint  Patient presents with   Fatigue   Cough   Generalized Body Aches    HPI Katrina Adams is a 17 y.o. female.    Cough Here for cough and malaise and nausea.  Symptoms began on October 21.  She has not had any fever or chills that she can tell.  No vomiting or diarrhea.  She did have a little wheezing earlier this week but that has improved.  Last menstrual cycle was October 12.  She states she is allergic to prednisone which causes facial swelling.  Past Medical History:  Diagnosis Date   Exercise-induced asthma    no inhaler use in 2 yr.   History of febrile seizure    age 82   Tonsillar and adenoid hypertrophy 10/2017   snores during sleep, mother denies apnea    Patient Active Problem List   Diagnosis Date Noted   Chronic constipation 07/09/2023   Gastroesophageal reflux disease without esophagitis 07/07/2023   Amenorrhea due to Depo Provera 05/07/2022   Migraine without aura and without status migrainosus, not intractable 05/06/2022   ADHD (attention deficit hyperactivity disorder), combined type 08/24/2021   Closed displaced fracture of neck of right fifth metacarpal bone 09/28/2020   Elevated serum free T4 level 05/28/2020   Adjustment disorder with mixed anxiety and depressed mood 05/28/2020   Sleep disturbance 05/28/2020   Idiopathic scoliosis 05/11/2016   Asthma, chronic 05/08/2013    Past Surgical History:  Procedure Laterality Date   CLOSED REDUCTION FINGER WITH PERCUTANEOUS PINNING Right 09/28/2020   Procedure: RIGHT CLOSED REDUCTION FINGER WITH PERCUTANEOUS PINNING FIFTH METACARPAL;  Surgeon: Teryl Lucy, MD;  Location: [facility];  Service: Orthopedics;  Laterality: Right;   TONSILLECTOMY AND ADENOIDECTOMY Bilateral 11/14/2017   Procedure: TONSILLECTOMY AND ADENOIDECTOMY;  Surgeon: Newman Pies, MD;   Location: [facility];  Service: ENT;  Laterality: Bilateral;    OB History   No obstetric history on file.      Home Medications    Prior to Admission medications   Medication Sig Start Date End Date Taking? Authorizing Provider  benzonatate (TESSALON) 100 MG capsule Take 1 capsule (100 mg total) by mouth 3 (three) times daily as needed for cough. 08/11/23  Yes Zenia Resides, MD  levonorgestrel (PLAN B 1-STEP) 1.5 MG tablet Take 1 tablet (1.5 mg total) by mouth once for 1 dose. 08/11/23 08/11/23 Yes Chrystie Hagwood, Janace Aris, MD  ondansetron (ZOFRAN-ODT) 4 MG disintegrating tablet Take 1 tablet (4 mg total) by mouth every 8 (eight) hours as needed for nausea or vomiting. 08/11/23  Yes Tevita Gomer, Janace Aris, MD  CONCERTA 27 MG CR tablet Take 1 tablet (27 mg total) by mouth every morning. 07/07/23   Campbell Riches, NP  linaclotide [facility]) 145 MCG CAPS capsule Take one capsule po daily for constipation 06/02/23   Campbell Riches, NP  methylphenidate (CONCERTA) 27 MG PO CR tablet Take 1 tablet (27 mg total) by mouth every morning. 07/07/23   Campbell Riches, NP  methylphenidate (CONCERTA) 27 MG PO CR tablet Take 1 tablet (27 mg total) by mouth every morning. 07/07/23   Campbell Riches, NP  omeprazole (PRILOSEC) 20 MG capsule Take 1 capsule (20 mg total) by mouth daily. Prn acid reflux 07/07/23 10/05/23  Campbell Riches, NP    Family History Family  History  Problem Relation Age of Onset   Hypertension Mother    Diabetes Maternal Grandmother     Social History Social History   Tobacco Use   Smoking status: Never    Passive exposure: Never   Smokeless tobacco: Never  Vaping Use   Vaping status: Never Used  Substance Use Topics   Alcohol use: No   Drug use: No     Allergies   Prednisone   Review of Systems Review of Systems  Respiratory:  Positive for cough.      Physical Exam Triage Vital Signs ED Triage Vitals  Encounter Vitals Group     BP  08/11/23 1244 116/75     Systolic BP Percentile --      Diastolic BP Percentile --      Pulse Rate 08/11/23 1244 93     Resp 08/11/23 1244 16     Temp 08/11/23 1244 99.1 F (37.3 C)     Temp Source 08/11/23 1244 Oral     SpO2 08/11/23 1244 97 %     Weight --      Height --      Head Circumference --      Peak Flow --      Pain Score 08/11/23 1245 4     Pain Loc --      Pain Education --      Exclude from Growth Chart --    No data found.  Updated Vital Signs BP 116/75 (BP Location: Right Arm)   Pulse 93   Temp 99.1 F (37.3 C) (Oral)   Resp 16   LMP 07/29/2023 (Approximate)   SpO2 97%   Visual Acuity Right Eye Distance:   Left Eye Distance:   Bilateral Distance:    Right Eye Near:   Left Eye Near:    Bilateral Near:     Physical Exam Vitals reviewed.  Constitutional:      General: She is not in acute distress.    Appearance: She is not ill-appearing, toxic-appearing or diaphoretic.  HENT:     Right Ear: Tympanic membrane and ear canal normal.     Left Ear: Tympanic membrane and ear canal normal.     Nose: Nose normal.     Mouth/Throat:     Mouth: Mucous membranes are moist.     Pharynx: No oropharyngeal exudate or posterior oropharyngeal erythema.  Eyes:     Extraocular Movements: Extraocular movements intact.     Conjunctiva/sclera: Conjunctivae normal.     Pupils: Pupils are equal, round, and reactive to light.  Cardiovascular:     Rate and Rhythm: Normal rate and regular rhythm.     Heart sounds: No murmur heard. Pulmonary:     Effort: No respiratory distress.     Breath sounds: No stridor. No wheezing, rhonchi or rales.     Comments: Breath sounds a little distant. Musculoskeletal:     Cervical back: Neck supple.  Lymphadenopathy:     Cervical: No cervical adenopathy.  Skin:    Capillary Refill: Capillary refill takes less than 2 seconds.     Coloration: Skin is not jaundiced or pale.  Neurological:     General: No focal deficit present.      Mental Status: She is alert and oriented to person, place, and time.  Psychiatric:        Behavior: Behavior normal.      UC Treatments / Results  Labs (all labs ordered are listed, but only abnormal results are  displayed) Labs Reviewed  POCT URINE PREGNANCY    EKG   Radiology No results found.  Procedures Procedures (including critical care time)  Medications Ordered in UC Medications - No data to display  Initial Impression / Assessment and Plan / UC Course  I have reviewed the triage vital signs and the nursing notes.  Pertinent labs & imaging results that were available during my care of the patient were reviewed by me and considered in my medical decision making (see chart for details).     Chest x-ray by my review is normal.  UPT was negative  She expresses concern that she had unprotected intercourse on October 23.  She requests a prescription for Plan B.  Plan B is sent to the pharmacy as is Zofran for nausea and Tessalon Perles for cough.  I have asked her to contact her primary care in [place] or to go to the clinic at [facility] where she goes school to try to get the birth control started. Final Clinical Impressions(s) / UC Diagnoses   Final diagnoses:  Viral upper respiratory tract infection  Unprotected sexual intercourse     Discharge Instructions      The pregnancy test was negative   by my review your chest x-ray is normal. The radiologist will also read your x-ray, and if their interpretation differs significantly from mine, we will call you.  Take benzonatate 100 mg, 1 tab every 8 hours as needed for cough.  Ondansetron dissolved in the mouth every 8 hours as needed for nausea or vomiting. Clear liquids(water, gatorade/pedialyte, ginger ale/sprite, chicken broth/soup) and bland things(crackers/toast, rice, potato, bananas) to eat. Avoid acidic foods like lemon/lime/orange/tomato, and avoid greasy/spicy foods.  11 norgestrel (Plan B) 1.5 mg  tablet--take 1 tablet once as soon as you get the prescription.  You need to take it within 72 hours of last intercourse for it to be effective.  Please talk to your primary care or to the clinic at [facility] to consider birth control        ED Prescriptions     Medication Sig Dispense Auth. Provider   benzonatate (TESSALON) 100 MG capsule Take 1 capsule (100 mg total) by mouth 3 (three) times daily as needed for cough. 21 capsule Zenia Resides, MD   ondansetron (ZOFRAN-ODT) 4 MG disintegrating tablet Take 1 tablet (4 mg total) by mouth every 8 (eight) hours as needed for nausea or vomiting. 10 tablet Zenia Resides, MD   levonorgestrel (PLAN B 1-STEP) 1.5 MG tablet Take 1 tablet (1.5 mg total) by mouth once for 1 dose. 1 tablet Marlinda Mike Janace Aris, MD      PDMP not reviewed this encounter.   Zenia Resides, MD 08/11/23 [id number]

## 2023-08-14 DIAGNOSIS — N899 Noninflammatory disorder of vagina, unspecified: Secondary | ICD-10-CM | POA: Diagnosis not present

## 2023-08-14 DIAGNOSIS — Z3045 Encounter for surveillance of transdermal patch hormonal contraceptive device: Secondary | ICD-10-CM | POA: Diagnosis not present

## 2023-10-17 ENCOUNTER — Other Ambulatory Visit: Payer: Self-pay | Admitting: Nurse Practitioner

## 2023-10-17 DIAGNOSIS — F902 Attention-deficit hyperactivity disorder, combined type: Secondary | ICD-10-CM

## 2023-10-19 MED ORDER — CONCERTA 27 MG PO TBCR
27.0000 mg | EXTENDED_RELEASE_TABLET | ORAL | 0 refills | Status: DC
  Filled 2023-10-19: qty 30, 30d supply, fill #0

## 2023-10-19 MED ORDER — LINACLOTIDE 145 MCG PO CAPS
145.0000 ug | ORAL_CAPSULE | Freq: Every day | ORAL | 5 refills | Status: DC
  Filled 2023-10-19: qty 30, 30d supply, fill #0

## 2023-10-19 MED ORDER — OMEPRAZOLE 20 MG PO CPDR
20.0000 mg | DELAYED_RELEASE_CAPSULE | Freq: Every day | ORAL | 1 refills | Status: AC
  Filled 2023-10-19: qty 90, 90d supply, fill #0

## 2023-10-20 ENCOUNTER — Other Ambulatory Visit: Payer: Self-pay

## 2023-10-24 ENCOUNTER — Other Ambulatory Visit: Payer: Self-pay

## 2023-11-24 DIAGNOSIS — R5383 Other fatigue: Secondary | ICD-10-CM | POA: Diagnosis not present

## 2023-11-24 DIAGNOSIS — Z114 Encounter for screening for human immunodeficiency virus [HIV]: Secondary | ICD-10-CM | POA: Diagnosis not present

## 2023-11-24 DIAGNOSIS — R42 Dizziness and giddiness: Secondary | ICD-10-CM | POA: Diagnosis not present

## 2023-11-24 DIAGNOSIS — B3731 Acute candidiasis of vulva and vagina: Secondary | ICD-10-CM | POA: Diagnosis not present

## 2023-11-24 DIAGNOSIS — Z113 Encounter for screening for infections with a predominantly sexual mode of transmission: Secondary | ICD-10-CM | POA: Diagnosis not present

## 2023-12-07 ENCOUNTER — Other Ambulatory Visit: Payer: Self-pay

## 2024-01-04 ENCOUNTER — Encounter: Payer: Self-pay | Admitting: Family Medicine

## 2024-01-04 ENCOUNTER — Ambulatory Visit: Admitting: Family Medicine

## 2024-01-04 VITALS — BP 112/72 | HR 79 | Temp 98.1°F | Ht 68.0 in | Wt 184.0 lb

## 2024-01-04 DIAGNOSIS — R5383 Other fatigue: Secondary | ICD-10-CM

## 2024-01-04 DIAGNOSIS — M2550 Pain in unspecified joint: Secondary | ICD-10-CM | POA: Diagnosis not present

## 2024-01-04 DIAGNOSIS — F902 Attention-deficit hyperactivity disorder, combined type: Secondary | ICD-10-CM

## 2024-01-04 DIAGNOSIS — R7989 Other specified abnormal findings of blood chemistry: Secondary | ICD-10-CM

## 2024-01-04 NOTE — Progress Notes (Signed)
   Subjective:    Patient ID: Katrina Adams, female    DOB: 2006-06-21, 18 y.o.   MRN: [id number]  Discussed the use of AI scribe software for clinical note transcription with the patient, who gave verbal consent to proceed.  History of Present Illness   Katrina Adams is a 18 year old female who presents with fatigue, joint pain, and facial puffiness.  For the past two weeks, she has experienced significant fatigue, describing a feeling of being 'washed out' with 'no energy'. She has been sleeping excessively, sometimes until late afternoon, which has impacted her ability to attend classes and maintain her daily routine. No recent illnesses, fevers, or significant infections have been noted.  Joint pain is primarily in the knees, back, and elbows, exacerbated by busy days and persisting into the evening, affecting her sleep. Back pain is particularly noticeable in the morning, though she has not engaged in any activities that might have strained her back.  Facial puffiness was first observed by her mother and has been noticed by her as well. She mentions a family history of similar symptoms, as her father experienced facial puffiness, prompting consideration of thyroid issues.  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 She lives with her mother and commutes to school, reporting limited social interaction due to her busy schedule. She is currently taking Concerta for focus but does not use it daily due to migraines. Previously found to have low vitamin D levels, she was prescribed 50,000 IU weekly for six months, though she feels no improvement in her symptoms.         Review of Systems     Objective:    Physical Exam   MUSCULOSKELETAL: Elbows tender bilaterally. Neck with crepitus on movement.     Lungs clear heart regular no swelling in the joints noted.  Extremities no edema      Assessment &  Plan:  Assessment and Plan    Fatigue and joint pain Increased fatigue and joint pain with facial puffiness. Differential includes hypothyroidism, vitamin deficiencies, or autoimmune conditions. - Order blood work for thyroid function and other potential causes.  Low vitamin D levels Extremely low vitamin D levels previously identified, no symptom improvement with supplementation. - Continue vitamin D supplementation as prescribed.  Stress management Significant stress from balancing school and work, affecting sleep and self-care. - Encourage time for self-care and social activities. - Advise reducing overstimulation before sleep.  Follow-up Open to further evaluation and management. - Send lab results and narrative via MyChart. - Encourage questions via MyChart.     Patient stress Some elements of depression but that the patient denies being depressed Not suicidal It is reasonable to do lab work to rule out the possibility of an inflammatory disease She does have a history of T4 being elevated We will see what her thyroid functions are doing We will follow her closely in regards to this I have encouraged her to do some stress relief techniques to try to minimize the amount of stress she is under  Follow-up within 4 to 5 months follow-up sooner problems

## 2024-01-05 LAB — CBC
Hematocrit: 39.5 % (ref 34.0–46.6)
Hemoglobin: 12.6 g/dL (ref 11.1–15.9)
MCH: 27.3 pg (ref 26.6–33.0)
MCHC: 31.9 g/dL (ref 31.5–35.7)
MCV: 86 fL (ref 79–97)
Platelets: 265 10*3/uL (ref 150–450)
RBC: 4.61 x10E6/uL (ref 3.77–5.28)
RDW: 12.1 % (ref 11.7–15.4)
WBC: 5.5 10*3/uL (ref 3.4–10.8)

## 2024-01-05 LAB — BASIC METABOLIC PANEL
BUN/Creatinine Ratio: 16 (ref 9–23)
BUN: 14 mg/dL (ref 6–20)
CO2: 23 mmol/L (ref 20–29)
Calcium: 9.8 mg/dL (ref 8.7–10.2)
Chloride: 103 mmol/L (ref 96–106)
Creatinine, Ser: 0.87 mg/dL (ref 0.57–1.00)
Glucose: 91 mg/dL (ref 70–99)
Potassium: 4.6 mmol/L (ref 3.5–5.2)
Sodium: 140 mmol/L (ref 134–144)
eGFR: 98 mL/min/{1.73_m2} (ref >59)

## 2024-01-05 LAB — TSH+FREE T4
Free T4: 1.87 ng/dL — ABNORMAL HIGH (ref 0.82–1.77)
TSH: 0.79 u[IU]/mL (ref 0.450–4.500)

## 2024-01-05 LAB — C-REACTIVE PROTEIN: CRP: 1 mg/L (ref 0–10)

## 2024-01-05 LAB — SEDIMENTATION RATE: Sed Rate: 16 mm/h (ref 0–32)

## 2024-01-07 ENCOUNTER — Encounter: Payer: Self-pay | Admitting: Family Medicine

## 2024-01-09 ENCOUNTER — Other Ambulatory Visit: Payer: Self-pay

## 2024-01-09 DIAGNOSIS — R5383 Other fatigue: Secondary | ICD-10-CM

## 2024-01-10 ENCOUNTER — Encounter: Payer: Self-pay | Admitting: Family Medicine

## 2024-01-10 NOTE — Telephone Encounter (Signed)
 Front office-please assist patient to get office visit with me in the open slot thank you May utilize [place] up my schedule completely full thank you  Please connect with Jon Gills regarding this thank you

## 2024-01-12 ENCOUNTER — Encounter: Payer: Self-pay | Admitting: Nurse Practitioner

## 2024-01-12 ENCOUNTER — Ambulatory Visit: Admitting: Nurse Practitioner

## 2024-01-12 VITALS — BP 101/66 | HR 74 | Temp 98.0°F | Ht 68.0 in | Wt 189.0 lb

## 2024-01-12 DIAGNOSIS — G479 Sleep disorder, unspecified: Secondary | ICD-10-CM

## 2024-01-12 DIAGNOSIS — G43009 Migraine without aura, not intractable, without status migrainosus: Secondary | ICD-10-CM

## 2024-01-12 DIAGNOSIS — F4323 Adjustment disorder with mixed anxiety and depressed mood: Secondary | ICD-10-CM | POA: Diagnosis not present

## 2024-01-12 DIAGNOSIS — F902 Attention-deficit hyperactivity disorder, combined type: Secondary | ICD-10-CM | POA: Diagnosis not present

## 2024-01-12 MED ORDER — METHYLPHENIDATE HCL ER (OSM) 27 MG PO TBCR: 27.0000 mg | EXTENDED_RELEASE_TABLET | ORAL | 0 refills | Status: DC

## 2024-01-12 MED ORDER — METHYLPHENIDATE HCL ER (OSM) 27 MG PO TBCR
27.0000 mg | EXTENDED_RELEASE_TABLET | ORAL | 0 refills | Status: DC
Start: 2024-01-12 — End: 2024-05-07

## 2024-01-12 MED ORDER — RIZATRIPTAN BENZOATE 10 MG PO TBDP
10.0000 mg | ORAL_TABLET | ORAL | 0 refills | Status: DC | PRN
Start: 2024-01-12 — End: 2024-08-09

## 2024-01-12 MED ORDER — METHYLPHENIDATE HCL ER (OSM) 27 MG PO TBCR
27.0000 mg | EXTENDED_RELEASE_TABLET | ORAL | 0 refills | Status: DC
Start: 2024-01-12 — End: 2024-02-27

## 2024-01-12 NOTE — Progress Notes (Signed)
 Subjective:    Patient ID: Katrina Adams, female    DOB: 08/13/2006, 18 y.o.   MRN: [id number]  HPI Patient was seen today for ADD checkup.  This patient does have ADD.  Patient takes medications for this.  If this does help control overall symptoms.  Please see below. -weight, vital signs reviewed.  The following items were covered. -Compliance with medication : Yes. Takes it every morning.   -Problems with completing homework, paying attention/taking good notes in school: Still has trouble focusing. Feels like it is due to increased stress.   -grades: Grades have been falling this semester. Is currently taking 16 credit hours of college courses, and working 25 + hours per week. Was working full time but has cut back on her hours. Feels like this has impacted her grades. She was on the chancellor's list due to excellent grades last semester but with the increased load is having trouble passing 2 of her classes.   -Eating patterns : Appetite decreased. No weight loss. Feels like it is due to increased stress.   -sleeping: Feels like her sleep is poor. Has trouble falling asleep due to racing thoughts. Drinks caffeine in the afternoon at times. Tries to avoid phone use before bed time. Takes melatonin before bed.   -Additional issues or questions: Got a letter for jury duty in the mail, and would like a letter due to not being able to focus.  Also endorses migraines possibly from the medication. Thinks that migraines are more frequent when she takes medication multiple days in a row. Has a history of migraines. Describes the pain as throbbing. Starts with tightness at the lateral neck area/trapezius, goes up the lateral face. Normally on the left side of the head, and is accompanied by nausea and slight blurred vision  in the left eye. No vomiting with migraines.Takes Tylenol and rest, which improves symptoms. Can last for hours. Sensitive to light and loud noises. Noticed that migraines  increase when she is more stressed. Reports that she does clinch and grind teeth when she is stressed. Denies any numbness of the face or extremities.  Not sexually active. No plans for pregnancy.   Review of Systems  Constitutional:  Positive for fatigue.  Eyes:  Positive for photophobia and visual disturbance.       Mild blurred vision left eye and sensitivity to light with migraines.   Respiratory:  Negative for cough, chest tightness, shortness of breath and wheezing.   Cardiovascular:  Negative for chest pain.  Neurological:  Positive for headaches. Negative for dizziness, facial asymmetry, speech difficulty, weakness, light-headedness and numbness.  Psychiatric/Behavioral:  Positive for decreased concentration and sleep disturbance. Negative for suicidal ideas. The patient is not nervous/anxious.       01/12/2024    2:07 PM  Depression screen PHQ 2/9  Decreased Interest 1  Down, Depressed, Hopeless 0  PHQ - 2 Score 1  Altered sleeping 3  Tired, decreased energy 2  Change in appetite 1  Feeling bad or failure about yourself  0  Trouble concentrating 0  Moving slowly or fidgety/restless 0  Suicidal thoughts 0  PHQ-9 Score 7  Difficult doing work/chores Not difficult at all      01/12/2024    2:08 PM 01/04/2024   10:46 AM 07/07/2023    3:15 PM 06/02/2023    2:47 PM  GAD 7 : Generalized Anxiety Score  Nervous, Anxious, on Edge 0 0 0 0  Control/stop worrying 0 0 0 0  Worry too much - different things 0 3 1 0  Trouble relaxing 0 0 0 1  Restless 0 0 0 0  Easily annoyed or irritable 0 2 0 2  Afraid - awful might happen 0 0 0 0  Total GAD 7 Score 0 5 1 3   Anxiety Difficulty Not difficult at all Not difficult at all Not difficult at all Somewhat difficult   Social History   Tobacco Use   Smoking status: Never    Passive exposure: Never   Smokeless tobacco: Never  Vaping Use   Vaping status: Never Used  Substance Use Topics   Alcohol use: No   Drug use: No         Objective:   Physical Exam Vitals and nursing note reviewed.  Constitutional:      General: She is not in acute distress.    Appearance: Normal appearance. She is not ill-appearing.  Cardiovascular:     Rate and Rhythm: Normal rate and regular rhythm.     Heart sounds: Normal heart sounds, S1 normal and S2 normal. No murmur heard. Pulmonary:     Effort: Pulmonary effort is normal. No respiratory distress.     Breath sounds: Normal breath sounds. No wheezing.  Neurological:     Mental Status: She is alert.     Gait: Gait normal.  Psychiatric:        Mood and Affect: Mood normal.        Behavior: Behavior normal.        Thought Content: Thought content normal.        Judgment: Judgment normal.    Vitals:   01/12/24 1403  BP: 101/66  Pulse: 74  Temp: 98 F (36.7 C)  Height: 5\' 8"  (1.727 m)  Weight: 189 lb (85.7 kg)  SpO2: 100%  BMI (Calculated): 28.74       Assessment & Plan:  1. Migraine without aura and without status migrainosus, not intractable  - rizatriptan (MAXALT-MLT) 10 MG disintegrating tablet; Take 1 tablet (10 mg total) by mouth as needed for migraine. May repeat in 2 hours if needed. Max 2 tablets per 24 hours.  Dispense: 10 tablet; Refill: 0 -Educated patient about keeping a headache diary to identify any particular triggers. Gave warning signs such as numbness of the face or extremities.   2. ADHD (attention deficit hyperactivity disorder), combined type (Primary)  - methylphenidate (CONCERTA) 27 MG PO CR tablet; Take 1 tablet (27 mg total) by mouth every morning.  Dispense: 30 tablet; Refill: 0 - methylphenidate (CONCERTA) 27 MG PO CR tablet; Take 1 tablet (27 mg total) by mouth every morning.  Dispense: 30 tablet; Refill: 0 - methylphenidate 27 MG PO CR tablet; Take 1 tablet (27 mg total) by mouth every morning.  Dispense: 30 tablet; Refill: 0  -Discussed with patient how the decrease in concentration is most likely due to stress, since that she has been  on this medication for years.  -Patient does not receive accommodations for school, and thinks that this could benefit her. Will wrote accomodation letter and send via MyChart. Will also send a jury duty letter.  3. Sleep disturbance -Educated patient about sleep hygiene including no phone use before bed, eliminating caffeine intake in the afternoon.   4. Adjustment disorder with mixed anxiety and depressed mood -Talked with patient about increasing stress levels causing issues with concentration and how symptoms of ADHD and anxiety can overlap. Offered patient medication for anxiety. Patient declined. Thinks that if she can  get through this semester, and decrease her credit hours for next semester that she will be able to manage this better.   Return in about 3 months (around 04/13/2024).  Call back sooner if needed.   I have seen and examined this patient alongside the NP student. I have reviewed and verified the student note and agree with the assessment and plan.  Sherie Don, FNP

## 2024-01-13 ENCOUNTER — Encounter: Payer: Self-pay | Admitting: Nurse Practitioner

## 2024-01-14 DIAGNOSIS — M545 Low back pain, unspecified: Secondary | ICD-10-CM | POA: Diagnosis not present

## 2024-01-14 DIAGNOSIS — R300 Dysuria: Secondary | ICD-10-CM | POA: Diagnosis not present

## 2024-01-14 DIAGNOSIS — N3001 Acute cystitis with hematuria: Secondary | ICD-10-CM | POA: Diagnosis not present

## 2024-02-09 ENCOUNTER — Ambulatory Visit: Payer: Self-pay

## 2024-02-09 ENCOUNTER — Ambulatory Visit: Admitting: Physician Assistant

## 2024-02-09 VITALS — BP 113/71 | HR 89 | Temp 98.6°F | Wt 193.0 lb

## 2024-02-09 DIAGNOSIS — N939 Abnormal uterine and vaginal bleeding, unspecified: Secondary | ICD-10-CM | POA: Diagnosis not present

## 2024-02-09 MED ORDER — MEGESTROL ACETATE 40 MG PO TABS: ORAL_TABLET | ORAL | 0 refills | Status: AC

## 2024-02-09 NOTE — Progress Notes (Signed)
 Acute Office Visit  Subjective:     Patient ID: Katrina Adams, female    DOB: February 02, 2006, 18 y.o.   MRN: [id number]   Patient presents today with concerns of vaginal bleeding x 2 weeks. She reports last normal menstrual cycle was in February. She states cycle in March was longer than usual, about 5-6 days. She reports her current cycle started 2 weeks ago. She report increased bleeding with clots and increased cramping. She denies concerns for pregnancy or STIs. Patient reports family history of endometriosis and fibroids in her mother.      Review of Systems  Constitutional:  Negative for fever and malaise/fatigue.  Respiratory:  Negative for shortness of breath.   Cardiovascular:  Negative for chest pain and palpitations.  Gastrointestinal:  Negative for abdominal pain, constipation, diarrhea, nausea and vomiting.  Neurological:  Negative for dizziness and headaches.        Objective:     BP 113/71   Pulse 89   Temp 98.6 F (37 C)   Wt 193 lb (87.5 kg)   LMP 01/28/2024 (Approximate)   SpO2 98%   BMI 29.35 kg/m   Physical Exam Constitutional:      Appearance: Normal appearance. She is normal weight.  HENT:     Head: Normocephalic and atraumatic.  Cardiovascular:     Rate and Rhythm: Normal rate and regular rhythm.     Heart sounds: Normal heart sounds. No murmur heard. Pulmonary:     Effort: Pulmonary effort is normal.     Breath sounds: Normal breath sounds.  Abdominal:     General: Abdomen is flat. Bowel sounds are normal.     Palpations: Abdomen is soft.     Tenderness: There is no abdominal tenderness.  Genitourinary:    General: Normal vulva.     Labia:        Right: No rash, tenderness or lesion.        Left: No rash, tenderness or lesion.      Vagina: No foreign body. Bleeding present. No erythema or lesions.     Cervix: Cervical bleeding present. No cervical motion tenderness, friability or lesion.     Comments: Chaperone deferred. Blood with  small clots present in vaginal canal, small active bleeding from the cervix  Neurological:     Mental Status: She is alert.     No results found for any visits on 02/09/24.      Assessment & Plan:  Vaginal bleeding Assessment & Plan: Patient stable on exam. No signs of hypovolemia or dehydration. Pelvic exam revealed blood with clots in vaginal canal. CBC, TSH, and hCG quant ordered today. TVUS ordered for further evaluation of uterus. We discussed birth control option for hormone replacement. Patient is not interested in birth control at this time. We will start Megace  taper over 15 days to decrease and stop vaginal bleeding. Case discussed with Katrina Aid, Katrina Adams. Patient to follow up in 2 weeks if symptoms are persisting. Warnings signs reviewed. Patient to follow up sooner for worsening bleeding, fatigue, dyspnea on exertion, or other new and worrying symptoms.   Orders: -     CBC with Differential/Platelet -     TSH + free T4 -     US  PELVIC COMPLETE WITH TRANSVAGINAL -     hCG, quantitative, pregnancy -     Megestrol  Acetate; Take 3 tablets (120 mg total) by mouth daily for 5 days, THEN 2 tablets (80 mg total) daily for 5 days, THEN  1 tablet (40 mg total) daily for 5 days.  Dispense: 30 tablet; Refill: 0     Return in about 2 weeks (around 02/23/2024).  Jearlean Mince Shirleymae Hauth, PA-C

## 2024-02-09 NOTE — Telephone Encounter (Signed)
 Chief Complaint: vaginal bleeding Symptoms: vaginal bleeding, abd pain Frequency: 2 wks Pertinent Negatives: Patient denies dizziness, weakness, lightheadedness, pallor, fever, vaginal discharge, urinary symptoms Disposition: [] ED /[] Urgent Care (no appt availability in office) / [x] Appointment(In office/virtual)/ []  [facility]/ [] Home Care/ [] Refused Recommended Disposition /[] [facility]/ []  Follow-up with PCP Additional Notes: Pt reports vaginal bleeding for 2 wks and states she is passing quarter-sized clots. Pt states she has to change her pad 3x/day. Pt states she sees clots whenever she wipes after using the bathroom. Pt states her periods never last this long. Pt states her last normal period was in February. Pt states her period in March lasted 5 days and felt abnormal to her. Pt reports 6/10 constant lower abdominal and lower back pain. Pt states this pain feels different than the typical cramps she experiences with a period. Pt denies dizziness or lightheadedness, weakness, pallor, fever, vaginal discharge, foul odor, and urinary symptoms. Pt is able to walk normally. Pain has not improved with Tylenol  or ibuprofen . RN advised pt she should be seen within 4 hrs, pt agreeable. RN scheduled pt for today at 1620 at [facility]. RN advised pt if she develops severe, continuous vaginal bleeding, vomiting, dizziness, weakness, or lightheadedness she should call 911. Pt verbalized understanding.    Reason for Disposition  [1] Constant abdominal pain AND [2] present > 2 hours  Answer Assessment - Initial Assessment Questions 1. AMOUNT: "Describe the bleeding that you are having."    - SPOTTING: spotting, or pinkish / brownish mucous discharge; does not fill panty liner or pad    - MILD:  less than 1 pad / hour; less than patient's usual menstrual bleeding   - MODERATE: 1-2 pads / hour; 1 menstrual cup every 6 hours; small-medium blood clots (e.g., pea, grape, small coin)   -  SEVERE: soaking 2 or more pads/hour for 2 or more hours; 1 menstrual cup every 2 hours; bleeding not contained by pads or continuous red blood from vagina; large blood clots (e.g., golf ball, large coin)      Using 3 pads a day -  clot sizes vary, the size of a quarter currently. Pt states she notices clots every time she wipes even after urinating 2. ONSET: "When did the bleeding begin?" "Is it continuing now?"     2 wks and states period came early 3. MENSTRUAL PERIOD: "When was the last normal menstrual period?" "How is this different than your period?"     Last normal period was "back in February" per pt, "in March it lasted for 5 days" 4. REGULARITY: "How regular are your periods?"     Pt states normally her periods are regular, until recently 5. ABDOMEN PAIN: "Do you have any pain?" "How bad is the pain?"  (e.g., Scale 1-10; mild, moderate, or severe)   - MILD (1-3): doesn't interfere with normal activities, abdomen soft and not tender to touch    - MODERATE (4-7): interferes with normal activities or awakens from sleep, abdomen tender to touch    - SEVERE (8-10): excruciating pain, doubled over, unable to do any normal activities      6/10 6. PREGNANCY: "Is there any chance you are pregnant?" "When was your last menstrual period?"     Last period was in March 7. BREASTFEEDING: "Are you breastfeeding?"     No 8. HORMONE MEDICINES: "Are you taking any hormone medicines, prescription or over-the-counter?" (e.g., birth control pills, estrogen)     Denies using birth  control 9. BLOOD THINNER MEDICINES: "Do you take any blood thinners?" (e.g., Coumadin / warfarin, Pradaxa / dabigatran, aspirin)     No  10. CAUSE: "What do you think is causing the bleeding?" (e.g., recent gyn surgery, recent gyn procedure; known bleeding disorder, cervical cancer, polycystic ovarian disease, fibroids)         No 11. HEMODYNAMIC STATUS: "Are you weak or feeling lightheaded?" If Yes, ask: "Can you stand and walk  normally?"        Denies weakness or feeling lightheadedness. Pt states she can walk normally. 12. OTHER SYMPTOMS: "What other symptoms are you having with the bleeding?" (e.g., passed tissue, vaginal discharge, fever, menstrual-type cramps)       Pt states she is passing clots. Denies N/V. Denies fever or chills. 6/10 abdominal pain - "usually on my period the cramps are on the "top part" of my stomach, now they are "lower down" - lower back and abdomen. States pain is "pretty constant." Tried tylenol  and ibuprofen , still reports feeling pain. Denies urinary symptoms.  Protocols used: Vaginal Bleeding - Abnormal-A-AH

## 2024-02-09 NOTE — Telephone Encounter (Signed)
 1st call attempt. Mailbox is full, unable to leave message  Message from [facility] sent at 02/09/2024  1:15 PM EDT  Summary: Menstrual bleeding   Copied From CRM [id number]. Reason for Triage: Pt wants to speak with a nurse about menstrual bleeding, bleeding for 2 weeks and its beginning to clot. Please contact patient back.

## 2024-02-10 ENCOUNTER — Encounter: Payer: Self-pay | Admitting: Physician Assistant

## 2024-02-10 DIAGNOSIS — N939 Abnormal uterine and vaginal bleeding, unspecified: Secondary | ICD-10-CM | POA: Insufficient documentation

## 2024-02-10 NOTE — Assessment & Plan Note (Signed)
 Patient stable on exam. No signs of hypovolemia or dehydration. Pelvic exam revealed blood with clots in vaginal canal. CBC, TSH, and hCG quant ordered today. TVUS ordered for further evaluation of uterus. We discussed birth control option for hormone replacement. Patient is not interested in birth control at this time. We will start Megace  taper over 15 days to decrease and stop vaginal bleeding. Case discussed with Michelle Aid, NP. Patient to follow up in 2 weeks if symptoms are persisting. Warnings signs reviewed. Patient to follow up sooner for worsening bleeding, fatigue, dyspnea on exertion, or other new and worrying symptoms.

## 2024-02-13 DIAGNOSIS — N939 Abnormal uterine and vaginal bleeding, unspecified: Secondary | ICD-10-CM | POA: Diagnosis not present

## 2024-02-13 LAB — HCG, QUANTITATIVE, PREGNANCY: HCG, Total, QN: <5 m[IU]/mL

## 2024-02-15 ENCOUNTER — Encounter: Payer: Self-pay | Admitting: Physician Assistant

## 2024-02-15 ENCOUNTER — Encounter: Payer: Self-pay | Admitting: Nurse Practitioner

## 2024-02-27 ENCOUNTER — Encounter (HOSPITAL_COMMUNITY): Payer: Self-pay | Admitting: *Deleted

## 2024-02-27 ENCOUNTER — Ambulatory Visit (HOSPITAL_COMMUNITY)
Admission: EM | Admit: 2024-02-27 | Discharge: 2024-02-27 | Disposition: A | Discharge status: Home / Self Care | Attending: Family Medicine | Admitting: Family Medicine

## 2024-02-27 DIAGNOSIS — N898 Other specified noninflammatory disorders of vagina: Secondary | ICD-10-CM | POA: Diagnosis not present

## 2024-02-27 NOTE — ED Provider Notes (Signed)
  [facility]   [id number] 02/27/24 Arrival Time: 1351  ASSESSMENT & PLAN:  1. Vaginal discharge    Declines any empiric treatment.   Discharge Instructions      You have been given the following today for treatment of suspected gonorrhea and/or chlamydia:  cefTRIAXone (ROCEPHIN) injection 500 mg  Please pick up your prescription for doxycycline 100 mg and begin taking twice daily for the next seven (7) days.  Even though we have treated you today, we have sent testing for various causes of vaginal infections. We will notify you of any positive results once they are received. If required, we will prescribe any medications you might need.  Please refrain from all sexual activity for at least the next seven days.    Without s/s of PID.  Labs Reviewed  CERVICOVAGINAL ANCILLARY ONLY   Will notify of any positive results. Instructed to refrain from sexual activity for at least seven days.  Reviewed expectations re: course of current medical issues. Questions answered. Outlined signs and symptoms indicating need for more acute intervention. Patient verbalized understanding. After Visit Summary given.   SUBJECTIVE:  Katrina Adams is a 18 y.o. female who presents with complaint of vaginal discharge. Pt states she has had vaginal itching and clumpy discharge X 3 days. She did try monistat OTC without relief.  Is sexually active.  Patient's last menstrual period was 01/28/2024 (approximate).   OBJECTIVE:  Vitals:   02/27/24 1453  BP: 107/74  Pulse: 72  Resp: 16  Temp: 98.4 F (36.9 C)  TempSrc: Oral  SpO2: 98%     General appearance: alert, cooperative, appears stated age and no distress Abdomen: S/NT GU: deferred Skin: warm and dry Psychological: alert and cooperative; normal mood and affect.    Labs Reviewed  CERVICOVAGINAL ANCILLARY ONLY    Allergies  Allergen Reactions   Prednisone      Past Medical History:  Diagnosis Date    Exercise-induced asthma    no inhaler use in 2 yr.   History of febrile seizure    age 19   Tonsillar and adenoid hypertrophy 10/2017   snores during sleep, mother denies apnea   Family History  Problem Relation Age of Onset   Hypertension Mother    Diabetes Maternal Grandmother    Social History   Socioeconomic History   Marital status: Single    Spouse name: Not on file   Number of children: Not on file   Years of education: Not on file   Highest education level: Not on file  Occupational History   Not on file  Tobacco Use   Smoking status: Never    Passive exposure: Never   Smokeless tobacco: Never  Vaping Use   Vaping status: Never Used  Substance and Sexual Activity   Alcohol use: No   Drug use: No   Sexual activity: Yes    Comment: IUD removed "over a year ago."  Other Topics Concern   Not on file  Social History Narrative   Not on file   Social Drivers of Health   Financial Resource Strain: Not on file  Food Insecurity: Not on file  Transportation Needs: Not on file  Physical Activity: Not on file  Stress: Not on file  Social Connections: Not on file  Intimate Partner Violence: Not on file           Afton Albright, MD 02/27/24 1554

## 2024-02-27 NOTE — Discharge Instructions (Addendum)
We have sent testing for various causes of vaginal infections. We will notify you of any positive results once they are received. If required, we will prescribe any medications you might need.  Please refrain from all sexual activity for at least the next seven days.  

## 2024-02-27 NOTE — ED Triage Notes (Signed)
 Pt states she has had vaginal itching and clumpy discharge X 3 days. She did try monistat OTC without relief.

## 2024-02-28 LAB — CERVICOVAGINAL ANCILLARY ONLY
Bacterial Vaginitis (gardnerella): NEGATIVE
Candida Glabrata: NEGATIVE
Candida Vaginitis: POSITIVE — AB
Chlamydia: NEGATIVE
Comment: NEGATIVE
Comment: NEGATIVE
Comment: NEGATIVE
Comment: NEGATIVE
Comment: NEGATIVE
Comment: NORMAL
Neisseria Gonorrhea: NEGATIVE
Trichomonas: NEGATIVE

## 2024-02-29 ENCOUNTER — Ambulatory Visit (HOSPITAL_COMMUNITY): Payer: Self-pay

## 2024-02-29 MED ORDER — FLUCONAZOLE 150 MG PO TABS: 150.0000 mg | ORAL_TABLET | Freq: Once | ORAL | 0 refills | Status: AC

## 2024-03-05 ENCOUNTER — Ambulatory Visit (HOSPITAL_COMMUNITY)
Admission: RE | Admit: 2024-03-05 | Discharge: 2024-03-05 | Disposition: A | Discharge status: Home / Self Care | Source: Ambulatory Visit | Attending: Physician Assistant | Admitting: Physician Assistant

## 2024-03-05 DIAGNOSIS — N939 Abnormal uterine and vaginal bleeding, unspecified: Secondary | ICD-10-CM | POA: Diagnosis not present

## 2024-03-07 ENCOUNTER — Ambulatory Visit: Payer: Self-pay | Admitting: Physician Assistant

## 2024-04-09 ENCOUNTER — Ambulatory Visit: Admitting: Nurse Practitioner

## 2024-05-07 ENCOUNTER — Encounter: Payer: Self-pay | Admitting: Nurse Practitioner

## 2024-05-07 ENCOUNTER — Ambulatory Visit: Admitting: Nurse Practitioner

## 2024-05-07 VITALS — BP 111/72 | HR 76 | Temp 98.2°F | Ht 68.0 in | Wt 196.0 lb

## 2024-05-07 DIAGNOSIS — K5909 Other constipation: Secondary | ICD-10-CM

## 2024-05-07 DIAGNOSIS — F902 Attention-deficit hyperactivity disorder, combined type: Secondary | ICD-10-CM | POA: Diagnosis not present

## 2024-05-07 MED ORDER — METHYLPHENIDATE HCL ER (OSM) 27 MG PO TBCR: 27.0000 mg | EXTENDED_RELEASE_TABLET | ORAL | 0 refills | Status: AC

## 2024-05-07 MED ORDER — LINACLOTIDE 145 MCG PO CAPS: 145.0000 ug | ORAL_CAPSULE | Freq: Every day | ORAL | 5 refills | Status: AC

## 2024-05-07 NOTE — Progress Notes (Unsigned)
 Subjective:    Patient ID: Katrina Adams, female    DOB: 06-Jan-2006, 18 y.o.   MRN: [id number]  HPI Subjective: Presents for recheck on ADD/ADHD today.  Compliant with medication: yes  Difficulty completing tasks or focusing: working  Side effects: none  Appetite: no change  Sleep: no changes   Additional issues or questions: Trying to eat healthy.  Doing about 20,000 steps per day for activity.  Request refill on Linzess  which is working great for her chronic constipation.  Migraines are much improved due to decreased stress.    05/07/2024   11:03 AM  Depression screen PHQ 2/9  Decreased Interest 0  Down, Depressed, Hopeless 0  PHQ - 2 Score 0  Altered sleeping 0  Tired, decreased energy 0  Change in appetite 0  Feeling bad or failure about yourself  0  Trouble concentrating 0  Moving slowly or fidgety/restless 0  Suicidal thoughts 0  PHQ-9 Score 0  Difficult doing work/chores Not difficult at all      05/07/2024   11:03 AM 01/12/2024    2:08 PM 01/04/2024   10:46 AM 07/07/2023    3:15 PM  GAD 7 : Generalized Anxiety Score  Nervous, Anxious, on Edge 0 0 0 0  Control/stop worrying 0 0 0 0  Worry too much - different things 0 0 3 1  Trouble relaxing 0 0 0 0  Restless 0 0 0 0  Easily annoyed or irritable 0 0 2 0  Afraid - awful might happen 0 0 0 0  Total GAD 7 Score 0 0 5 1  Anxiety Difficulty Not difficult at all Not difficult at all Not difficult at all Not difficult at all    Social History   Tobacco Use   Smoking status: Never    Passive exposure: Never   Smokeless tobacco: Never  Vaping Use   Vaping status: Never Used  Substance Use Topics   Alcohol use: No   Drug use: No     Review of Systems  Respiratory:  Negative for chest tightness and shortness of breath.   Cardiovascular:  Negative for chest pain and palpitations.  Gastrointestinal:  Negative for abdominal pain, blood in stool, constipation and diarrhea.  Psychiatric/Behavioral:   Positive for decreased concentration. Negative for sleep disturbance and suicidal ideas.        Objective:   Physical Exam Vitals and nursing note reviewed.  Constitutional:      General: She is not in acute distress. Cardiovascular:     Rate and Rhythm: Normal rate and regular rhythm.     Heart sounds: Normal heart sounds.  Pulmonary:     Effort: Pulmonary effort is normal.     Breath sounds: Normal breath sounds.  Abdominal:     General: There is no distension.     Palpations: Abdomen is soft. There is no mass.     Tenderness: There is no abdominal tenderness.  Neurological:     Mental Status: She is alert.  Psychiatric:        Mood and Affect: Mood normal.        Behavior: Behavior normal.        Thought Content: Thought content normal.        Judgment: Judgment normal.    Today's Vitals   05/07/24 1058  BP: 111/72  Pulse: 76  Temp: 98.2 F (36.8 C)  SpO2: 98%  Weight: 196 lb (88.9 kg)  Height: 5' 8 (1.727 m)   Body  mass index is 29.8 kg/m.        Assessment & Plan:   Problem List Items Addressed This Visit       Digestive   Chronic constipation - Primary     Other   ADHD (attention deficit hyperactivity disorder), combined type   Relevant Medications   methylphenidate  (CONCERTA ) 27 MG PO CR tablet   Meds ordered this encounter  Medications   linaclotide  (LINZESS ) 145 MCG CAPS capsule    Sig: Take 1 capsule (145 mcg total) by mouth daily for constipation    Dispense:  30 capsule    Refill:  5   methylphenidate  (CONCERTA ) 27 MG PO CR tablet    Sig: Take 1 tablet (27 mg total) by mouth every morning.    Dispense:  30 tablet    Refill:  0    Dispense name brand Concerta    methylphenidate  (CONCERTA ) 27 MG PO CR tablet    Sig: Take 1 tablet (27 mg total) by mouth every morning.    Dispense:  30 tablet    Refill:  0    Please dispense name brand Concerta ; may fill 30 days from 05/07/24    Supervising Provider:   ALPHONSA HAMILTON A [[id number]]    methylphenidate  (CONCERTA ) 27 MG PO CR tablet    Sig: Take 1 tablet (27 mg total) by mouth every morning.    Dispense:  30 tablet    Refill:  0    Dispense name brand Concerta ; may fill 60 days from 05/07/24    Supervising Provider:   ALPHONSA HAMILTON A [[id number]]   Continue methylphenidate  as directed. Continue Linzess  as directed. Recommend preventive health physical next spring including Pap smear. Return in about 3 months (around 08/07/2024).

## 2024-05-09 ENCOUNTER — Encounter: Payer: Self-pay | Admitting: Nurse Practitioner

## 2024-05-15 ENCOUNTER — Ambulatory Visit (HOSPITAL_COMMUNITY)
Admission: EM | Admit: 2024-05-15 | Discharge: 2024-05-15 | Disposition: A | Discharge status: Home / Self Care | Attending: Family Medicine | Admitting: Family Medicine

## 2024-05-15 ENCOUNTER — Encounter (HOSPITAL_COMMUNITY): Payer: Self-pay

## 2024-05-15 DIAGNOSIS — R82998 Other abnormal findings in urine: Secondary | ICD-10-CM | POA: Insufficient documentation

## 2024-05-15 DIAGNOSIS — N3090 Cystitis, unspecified without hematuria: Secondary | ICD-10-CM | POA: Insufficient documentation

## 2024-05-15 DIAGNOSIS — R350 Frequency of micturition: Secondary | ICD-10-CM | POA: Diagnosis not present

## 2024-05-15 DIAGNOSIS — R109 Unspecified abdominal pain: Secondary | ICD-10-CM | POA: Insufficient documentation

## 2024-05-15 LAB — POCT URINALYSIS DIP (MANUAL ENTRY)
Bilirubin, UA: NEGATIVE
Blood, UA: NEGATIVE
Glucose, UA: NEGATIVE mg/dL
Ketones, POC UA: NEGATIVE mg/dL
Nitrite, UA: NEGATIVE
Protein Ur, POC: NEGATIVE mg/dL
Spec Grav, UA: 1.025 (ref 1.010–1.025)
Urobilinogen, UA: 0.2 U/dL
pH, UA: 6 (ref 5.0–8.0)

## 2024-05-15 LAB — POCT URINE PREGNANCY: Preg Test, Ur: NEGATIVE

## 2024-05-15 MED ORDER — CEPHALEXIN 500 MG PO CAPS: 500.0000 mg | ORAL_CAPSULE | Freq: Two times a day (BID) | ORAL | 0 refills | Status: DC

## 2024-05-15 NOTE — ED Triage Notes (Signed)
 Patient presents to the office for STI testing. Patient reports an odor for several days. Patient reports left lower abdominal cramping x 4 days.

## 2024-05-15 NOTE — Discharge Instructions (Addendum)
In addition to a urine culture, we have sent testing for sexually transmitted infections. We will notify you of any positive results once they are received. If required, we will prescribe any medications you might need.  Please refrain from all sexual activity for at least the next seven days.  

## 2024-05-16 ENCOUNTER — Ambulatory Visit (HOSPITAL_COMMUNITY): Payer: Self-pay

## 2024-05-16 LAB — CERVICOVAGINAL ANCILLARY ONLY
Bacterial Vaginitis (gardnerella): POSITIVE — AB
Candida Glabrata: NEGATIVE
Candida Vaginitis: POSITIVE — AB
Chlamydia: NEGATIVE
Comment: NEGATIVE
Comment: NEGATIVE
Comment: NEGATIVE
Comment: NEGATIVE
Comment: NEGATIVE
Comment: NORMAL
Neisseria Gonorrhea: NEGATIVE
Trichomonas: NEGATIVE

## 2024-05-16 MED ORDER — FLUCONAZOLE 150 MG PO TABS
150.0000 mg | ORAL_TABLET | Freq: Once | ORAL | 0 refills | Status: AC
Start: 2024-05-16 — End: 2024-05-16

## 2024-05-16 MED ORDER — METRONIDAZOLE 500 MG PO TABS
500.0000 mg | ORAL_TABLET | Freq: Two times a day (BID) | ORAL | 0 refills | Status: AC
Start: 2024-05-16 — End: 2024-05-23

## 2024-05-16 NOTE — ED Provider Notes (Signed)
 [facility]   [id number] 05/15/24 Arrival Time: 1548  ASSESSMENT & PLAN:  1. Abdominal cramping   2. Urinary frequency   3. Urine leukocytes   4. Cystitis    Meds ordered this encounter  Medications   cephALEXin  (KEFLEX ) 500 MG capsule    Sig: Take 1 capsule (500 mg total) by mouth 2 (two) times daily.    Dispense:  10 capsule    Refill:  0     Discharge Instructions      In addition to a urine culture, we have sent testing for sexually transmitted infections. We will notify you of any positive results once they are received. If required, we will prescribe any medications you might need.  Please refrain from all sexual activity for at least the next seven days.     Without s/s of PID.  Labs Reviewed  POCT URINALYSIS DIP (MANUAL ENTRY) - Abnormal; Notable for the following components:      Result Value   Leukocytes, UA Small (1+) (*)    All other components within normal limits  URINE CULTURE  POCT URINE PREGNANCY  CERVICOVAGINAL ANCILLARY ONLY    Will notify of any positive results. Instructed to refrain from sexual activity for at least seven days.  Reviewed expectations re: course of current medical issues. Questions answered. Outlined signs and symptoms indicating need for more acute intervention. Patient verbalized understanding. After Visit Summary given.   SUBJECTIVE:  Katrina Adams is a 18 y.o. female who presents with complaint of vaginal odor and urinary freq; mild abd cramping that is suprapubic. Desires STI testing. Denies n/v/fever. Is sexually active.    OBJECTIVE:  Vitals:   05/15/24 1628  BP: (!) 146/82  Pulse: 82  Resp: 18  Temp: 98.2 F (36.8 C)  TempSrc: Oral  SpO2: 97%     General appearance: alert, cooperative, appears stated age and no distress Lungs: unlabored respirations; speaks full sentences without difficulty Back: no CVA tenderness; FROM at waist Abdomen: soft, non-tender GU: deferred Skin: warm and  dry Psychological: alert and cooperative; normal mood and affect.  Results for orders placed or performed during the hospital encounter of 05/15/24  POCT urine pregnancy   Collection Time: 05/15/24  5:02 PM  Result Value Ref Range   Preg Test, Ur Negative Negative  POC urinalysis dipstick   Collection Time: 05/15/24  5:02 PM  Result Value Ref Range   Color, UA yellow yellow   Clarity, UA clear clear   Glucose, UA negative negative mg/dL   Bilirubin, UA negative negative   Ketones, POC UA negative negative mg/dL   Spec Grav, UA 8.974 8.989 - 1.025   Blood, UA negative negative   pH, UA 6.0 5.0 - 8.0   Protein Ur, POC negative negative mg/dL   Urobilinogen, UA 0.2 0.2 or 1.0 E.U./dL   Nitrite, UA Negative Negative   Leukocytes, UA Small (1+) (A) Negative    Labs Reviewed  POCT URINALYSIS DIP (MANUAL ENTRY) - Abnormal; Notable for the following components:      Result Value   Leukocytes, UA Small (1+) (*)    All other components within normal limits  URINE CULTURE  POCT URINE PREGNANCY  CERVICOVAGINAL ANCILLARY ONLY    Allergies  Allergen Reactions   Prednisone      Past Medical History:  Diagnosis Date   Exercise-induced asthma    no inhaler use in 2 yr.   History of febrile seizure    age 77   Tonsillar and  adenoid hypertrophy 10/2017   snores during sleep, mother denies apnea   Family History  Problem Relation Age of Onset   Hypertension Mother    Diabetes Maternal Grandmother    Social History   Socioeconomic History   Marital status: Single    Spouse name: Not on file   Number of children: Not on file   Years of education: Not on file   Highest education level: Not on file  Occupational History   Not on file  Tobacco Use   Smoking status: Never    Passive exposure: Never   Smokeless tobacco: Never  Vaping Use   Vaping status: Never Used  Substance and Sexual Activity   Alcohol use: No   Drug use: No   Sexual activity: Yes    Comment: IUD  removed over a year ago.  Other Topics Concern   Not on file  Social History Narrative   Not on file   Social Drivers of Health   Financial Resource Strain: Not on file  Food Insecurity: Not on file  Transportation Needs: Not on file  Physical Activity: Not on file  Stress: Not on file  Social Connections: Not on file  Intimate Partner Violence: Not on file           Rolinda Rogue, MD 05/16/24 [phone]

## 2024-05-17 LAB — URINE CULTURE: Culture: 30000 — AB

## 2024-07-02 DIAGNOSIS — N899 Noninflammatory disorder of vagina, unspecified: Secondary | ICD-10-CM | POA: Diagnosis not present

## 2024-07-02 DIAGNOSIS — Z113 Encounter for screening for infections with a predominantly sexual mode of transmission: Secondary | ICD-10-CM | POA: Diagnosis not present

## 2024-07-30 DIAGNOSIS — Z113 Encounter for screening for infections with a predominantly sexual mode of transmission: Secondary | ICD-10-CM | POA: Diagnosis not present

## 2024-07-30 DIAGNOSIS — N390 Urinary tract infection, site not specified: Secondary | ICD-10-CM | POA: Diagnosis not present

## 2024-08-09 ENCOUNTER — Other Ambulatory Visit: Payer: Self-pay

## 2024-08-09 ENCOUNTER — Ambulatory Visit (HOSPITAL_COMMUNITY): Admission: EM | Admit: 2024-08-09 | Discharge: 2024-08-09 | Disposition: A | Discharge status: Home / Self Care

## 2024-08-09 ENCOUNTER — Encounter (HOSPITAL_COMMUNITY): Payer: Self-pay | Admitting: *Deleted

## 2024-08-09 DIAGNOSIS — N3001 Acute cystitis with hematuria: Secondary | ICD-10-CM

## 2024-08-09 LAB — POCT URINALYSIS DIP (MANUAL ENTRY)
Bilirubin, UA: NEGATIVE
Glucose, UA: NEGATIVE mg/dL
Ketones, POC UA: NEGATIVE mg/dL
Nitrite, UA: POSITIVE — AB
Spec Grav, UA: 1.025 (ref 1.010–1.025)
Urobilinogen, UA: 1 U/dL
pH, UA: 7 (ref 5.0–8.0)

## 2024-08-09 MED ORDER — SULFAMETHOXAZOLE-TRIMETHOPRIM 800-160 MG PO TABS: 1.0000 | ORAL_TABLET | Freq: Two times a day (BID) | ORAL | 0 refills | Status: AC

## 2024-08-09 MED ORDER — PHENAZOPYRIDINE HCL 200 MG PO TABS: 200.0000 mg | ORAL_TABLET | Freq: Three times a day (TID) | ORAL | 0 refills | Status: AC

## 2024-08-09 NOTE — ED Triage Notes (Signed)
 PT reports lower ABD cramping and dysuria.

## 2024-08-09 NOTE — ED Provider Notes (Signed)
 [facility]  Note:  This document was prepared using Conservation officer, historic buildings and may include unintentional dictation errors.  MRN: [id number] DOB: 24-Sep-2006  Subjective:   Katrina Adams is a 18 y.o. female presenting for suprapubic abdominal pressure, dysuria x 2 days.  Patient reports she has had similar symptoms in the past when she has a urinary tract infection.  Patient has not tried any over-the-counter medication to treat symptoms prior to arrival today.  Patient reports that she is at the end of her menstrual cycle and is still having some vaginal bleeding.  Patient denies any increased urinary frequency, severe abdominal pain, flank pain, nausea/vomiting, diarrhea.  No current facility-administered medications for this encounter.  Current Outpatient Medications:    linaclotide  (LINZESS ) 145 MCG CAPS capsule, Take 1 capsule (145 mcg total) by mouth daily for constipation, Disp: 30 capsule, Rfl: 5   methylphenidate  (CONCERTA ) 27 MG PO CR tablet, Take 1 tablet (27 mg total) by mouth every morning., Disp: 30 tablet, Rfl: 0   omeprazole  (PRILOSEC) 20 MG capsule, Take 1 capsule (20 mg total) by mouth daily as needed for acid reflux, Disp: 90 capsule, Rfl: 1   phenazopyridine (PYRIDIUM) 200 MG tablet, Take 1 tablet (200 mg total) by mouth 3 (three) times daily., Disp: 6 tablet, Rfl: 0   sulfamethoxazole -trimethoprim  (BACTRIM  DS) 800-160 MG tablet, Take 1 tablet by mouth 2 (two) times daily for 7 days., Disp: 14 tablet, Rfl: 0   methylphenidate  (CONCERTA ) 27 MG PO CR tablet, Take 1 tablet (27 mg total) by mouth every morning., Disp: 30 tablet, Rfl: 0   methylphenidate  (CONCERTA ) 27 MG PO CR tablet, Take 1 tablet (27 mg total) by mouth every morning., Disp: 30 tablet, Rfl: 0   Allergies  Allergen Reactions   Prednisone      Past Medical History:  Diagnosis Date   Exercise-induced asthma    no inhaler use in 2 yr.   History of febrile seizure    age 13    Tonsillar and adenoid hypertrophy 10/2017   snores during sleep, mother denies apnea     Past Surgical History:  Procedure Laterality Date   CLOSED REDUCTION FINGER WITH PERCUTANEOUS PINNING Right 09/28/2020   Procedure: RIGHT CLOSED REDUCTION FINGER WITH PERCUTANEOUS PINNING FIFTH METACARPAL;  Surgeon: Josefina Chew, MD;  Location: [facility];  Service: Orthopedics;  Laterality: Right;   TONSILLECTOMY AND ADENOIDECTOMY Bilateral 11/14/2017   Procedure: TONSILLECTOMY AND ADENOIDECTOMY;  Surgeon: Karis Clunes, MD;  Location: [facility];  Service: ENT;  Laterality: Bilateral;    Family History  Problem Relation Age of Onset   Hypertension Mother    Diabetes Maternal Grandmother     Social History   Tobacco Use   Smoking status: Never    Passive exposure: Never   Smokeless tobacco: Never  Vaping Use   Vaping status: Never Used  Substance Use Topics   Alcohol use: No   Drug use: No    ROS Refer to HPI for ROS details.  Objective:    Vitals: BP 124/77   Pulse 86   Temp 98.6 F (37 C)   Resp 20   LMP 08/06/2024   SpO2 98%   Physical Exam Vitals and nursing note reviewed.  Constitutional:      General: She is not in acute distress.    Appearance: She is well-developed. She is not ill-appearing or toxic-appearing.  HENT:     Head: Normocephalic and atraumatic.     Mouth/Throat:  Mouth: Mucous membranes are moist.  Cardiovascular:     Rate and Rhythm: Normal rate.  Pulmonary:     Effort: Pulmonary effort is normal. No respiratory distress.  Abdominal:     General: There is no distension.     Palpations: Abdomen is soft.     Tenderness: There is abdominal tenderness in the suprapubic area. There is no right CVA tenderness, left CVA tenderness, guarding or rebound.  Musculoskeletal:        General: Normal range of motion.  Skin:    General: Skin is warm and dry.  Neurological:     General: No focal deficit present.     Mental  Status: She is alert and oriented to person, place, and time.  Psychiatric:        Mood and Affect: Mood normal.        Behavior: Behavior normal.     Procedures  Results for orders placed or performed during the hospital encounter of 08/09/24 (from the past 24 hours)  POCT urinalysis dipstick     Status: Abnormal   Collection Time: 08/09/24  5:03 PM  Result Value Ref Range   Color, UA yellow yellow   Clarity, UA cloudy (A) clear   Glucose, UA negative negative mg/dL   Bilirubin, UA negative negative   Ketones, POC UA negative negative mg/dL   Spec Grav, UA 8.974 8.989 - 1.025   Blood, UA moderate (A) negative   pH, UA 7.0 5.0 - 8.0   Protein Ur, POC trace (A) negative mg/dL   Urobilinogen, UA 1.0 0.2 or 1.0 E.U./dL   Nitrite, UA Positive (A) Negative   Leukocytes, UA Trace (A) Negative    Assessment and Plan :     Discharge Instructions       1. Acute cystitis with hematuria (Primary) - POCT urinalysis dipstick completed in UC positive nitrite, moderate blood, trace leukocytes, these findings are possibly indicative of urinary tract infection - sulfamethoxazole -trimethoprim  (BACTRIM  DS) 800-160 MG tablet; Take 1 tablet by mouth 2 (two) times daily for 7 days.  Dispense: 14 tablet; Refill: 0 - phenazopyridine (PYRIDIUM) 200 MG tablet; Take 1 tablet (200 mg total) by mouth 3 (three) times daily.  Dispense: 6 tablet; Refill: 0 - Urine Culture collected in UC and sent to lab for further testing results will be available in 2 to 3 days. -Continue to monitor symptoms for any change in severity if there is any escalation of current symptoms or development of new symptoms follow-up in ER for further evaluation and management.      Katrina Adams   Monice Lundy, [place], [place] 08/09/24 1729

## 2024-08-09 NOTE — Discharge Instructions (Signed)
  1. Acute cystitis with hematuria (Primary) - POCT urinalysis dipstick completed in UC positive nitrite, moderate blood, trace leukocytes, these findings are possibly indicative of urinary tract infection - sulfamethoxazole -trimethoprim  (BACTRIM  DS) 800-160 MG tablet; Take 1 tablet by mouth 2 (two) times daily for 7 days.  Dispense: 14 tablet; Refill: 0 - phenazopyridine (PYRIDIUM) 200 MG tablet; Take 1 tablet (200 mg total) by mouth 3 (three) times daily.  Dispense: 6 tablet; Refill: 0 - Urine Culture collected in UC and sent to lab for further testing results will be available in 2 to 3 days. -Continue to monitor symptoms for any change in severity if there is any escalation of current symptoms or development of new symptoms follow-up in ER for further evaluation and management.

## 2024-08-11 LAB — URINE CULTURE
Culture: >=100000 — AB
Special Requests: NORMAL

## 2024-08-12 ENCOUNTER — Ambulatory Visit (HOSPITAL_COMMUNITY): Payer: Self-pay

## 2024-08-13 ENCOUNTER — Ambulatory Visit: Admitting: Nurse Practitioner

## 2024-08-26 DIAGNOSIS — Z114 Encounter for screening for human immunodeficiency virus [HIV]: Secondary | ICD-10-CM | POA: Diagnosis not present

## 2024-08-26 DIAGNOSIS — N760 Acute vaginitis: Secondary | ICD-10-CM | POA: Diagnosis not present

## 2024-08-26 DIAGNOSIS — Z113 Encounter for screening for infections with a predominantly sexual mode of transmission: Secondary | ICD-10-CM | POA: Diagnosis not present

## 2024-11-16 ENCOUNTER — Other Ambulatory Visit: Payer: Self-pay

## 2024-11-16 ENCOUNTER — Emergency Department (HOSPITAL_BASED_OUTPATIENT_CLINIC_OR_DEPARTMENT_OTHER)
Admission: EM | Admit: 2024-11-16 | Discharge: 2024-11-16 | Disposition: A | Discharge status: Home / Self Care | Attending: Emergency Medicine | Admitting: Emergency Medicine

## 2024-11-16 DIAGNOSIS — R1030 Lower abdominal pain, unspecified: Secondary | ICD-10-CM | POA: Insufficient documentation

## 2024-11-16 DIAGNOSIS — N939 Abnormal uterine and vaginal bleeding, unspecified: Secondary | ICD-10-CM | POA: Diagnosis present

## 2024-11-16 LAB — URINALYSIS, ROUTINE W REFLEX MICROSCOPIC
Bacteria, UA: NONE SEEN
Bilirubin Urine: NEGATIVE
Glucose, UA: NEGATIVE mg/dL
Leukocytes,Ua: NEGATIVE
Nitrite: NEGATIVE
Protein, ur: 30 mg/dL — AB
RBC / HPF: >50 RBC/hpf (ref 0–5)
Specific Gravity, Urine: >1.046 — ABNORMAL HIGH (ref 1.005–1.030)
pH: 5.5 (ref 5.0–8.0)

## 2024-11-16 LAB — CBC WITH DIFFERENTIAL/PLATELET
Abs Immature Granulocytes: 0.01 10*3/uL (ref 0.00–0.07)
Basophils Absolute: 0 10*3/uL (ref 0.0–0.1)
Basophils Relative: 1 %
Eosinophils Absolute: 0.1 10*3/uL (ref 0.0–0.5)
Eosinophils Relative: 1 %
HCT: 37.3 % (ref 36.0–46.0)
Hemoglobin: 12 g/dL (ref 12.0–15.0)
Immature Granulocytes: 0 %
Lymphocytes Relative: 47 %
Lymphs Abs: 2.8 10*3/uL (ref 0.7–4.0)
MCH: 26.7 pg (ref 26.0–34.0)
MCHC: 32.2 g/dL (ref 30.0–36.0)
MCV: 83.1 fL (ref 80.0–100.0)
Monocytes Absolute: 0.6 10*3/uL (ref 0.1–1.0)
Monocytes Relative: 10 %
Neutro Abs: 2.4 10*3/uL (ref 1.7–7.7)
Neutrophils Relative %: 41 %
Platelets: 244 10*3/uL (ref 150–400)
RBC: 4.49 MIL/uL (ref 3.87–5.11)
RDW: 13.5 % (ref 11.5–15.5)
WBC: 5.9 10*3/uL (ref 4.0–10.5)
nRBC: 0 % (ref 0.0–0.2)

## 2024-11-16 LAB — WET PREP, GENITAL
Clue Cells Wet Prep HPF POC: NONE SEEN
Sperm: NONE SEEN
Trich, Wet Prep: NONE SEEN
WBC, Wet Prep HPF POC: <10
Yeast Wet Prep HPF POC: NONE SEEN

## 2024-11-16 LAB — PREGNANCY, URINE: Preg Test, Ur: NEGATIVE

## 2024-11-16 MED ORDER — KETOROLAC TROMETHAMINE 30 MG/ML IJ SOLN
30.0000 mg | Freq: Once | INTRAMUSCULAR | Status: AC
  Administered 2024-11-16: 30 mg via INTRAMUSCULAR
  Filled 2024-11-16: qty 1

## 2024-11-16 NOTE — ED Triage Notes (Signed)
 Pt arrives POV with complaints of abdominal cramping and vaginal bleeding that started yesterday. States that this is heavier than her normal period but is also 4 days late on her cycle.

## 2024-11-16 NOTE — ED Provider Notes (Signed)
 " [place] EMERGENCY DEPARTMENT AT [facility] Provider Note   CSN: [id number] Arrival date & time: 11/16/24  9656     Patient presents with: No chief complaint on file.   Katrina Adams is a 19 y.o. female.   HPI     This is a 19 year old female who presents with concern for vaginal bleeding.  Patient reports that she has had lower abdominal cramping and vaginal bleeding.  She states that her menstrual period was 4 days late and this seems more heavy than normal.  She reports passage of clots.  Has not taken a pregnancy test but does not believe herself to be pregnant.  No urinary symptoms.  Denies any concerns for STDs.  Prior to Admission medications  Medication Sig Start Date End Date Taking? Authorizing Provider  linaclotide  (LINZESS ) 145 MCG CAPS capsule Take 1 capsule (145 mcg total) by mouth daily for constipation 05/07/24   Hoskins, Carolyn C, NP  methylphenidate  (CONCERTA ) 27 MG PO CR tablet Take 1 tablet (27 mg total) by mouth every morning. 05/07/24   Hoskins, Carolyn C, NP  methylphenidate  (CONCERTA ) 27 MG PO CR tablet Take 1 tablet (27 mg total) by mouth every morning. 05/07/24   Hoskins, Carolyn C, NP  methylphenidate  (CONCERTA ) 27 MG PO CR tablet Take 1 tablet (27 mg total) by mouth every morning. 05/07/24   Hoskins, Carolyn C, NP  omeprazole  (PRILOSEC) 20 MG capsule Take 1 capsule (20 mg total) by mouth daily as needed for acid reflux 10/19/23   Alphonsa Glendia LABOR, MD  phenazopyridine  (PYRIDIUM ) 200 MG tablet Take 1 tablet (200 mg total) by mouth 3 (three) times daily. 08/09/24   Reddick, Ethel B, NP    Allergies: Prednisone     Review of Systems  Constitutional:  Negative for fever.  Gastrointestinal:  Positive for abdominal pain. Negative for diarrhea, nausea and vomiting.  Genitourinary:  Positive for vaginal bleeding.  All other systems reviewed and are negative.   Updated Vital Signs BP (!) 143/71 (BP Location: Right Arm)   Pulse 80   Temp (!) 97.5  F (36.4 C)   Resp 16   Ht 1.727 m (5' 8)   Wt 87.1 kg   SpO2 97%   BMI 29.19 kg/m   Physical Exam Vitals and nursing note reviewed.  Constitutional:      Appearance: She is well-developed.  HENT:     Head: Normocephalic and atraumatic.  Eyes:     Pupils: Pupils are equal, round, and reactive to light.  Cardiovascular:     Rate and Rhythm: Normal rate and regular rhythm.     Heart sounds: Normal heart sounds.  Pulmonary:     Effort: Pulmonary effort is normal. No respiratory distress.     Breath sounds: No wheezing.  Abdominal:     General: Bowel sounds are normal.     Palpations: Abdomen is soft.     Tenderness: There is no abdominal tenderness. There is no guarding or rebound.  Genitourinary:    Comments: Normal external vaginal exam, blood noted in the vaginal vault, no significant hemorrhage noted, no adnexal tenderness Musculoskeletal:     Cervical back: Neck supple.  Skin:    General: Skin is warm and dry.  Neurological:     Mental Status: She is alert and oriented to person, place, and time.  Psychiatric:        Mood and Affect: Mood normal.     (all labs ordered are listed, but only abnormal results are  displayed) Labs Reviewed  URINALYSIS, ROUTINE W REFLEX MICROSCOPIC - Abnormal; Notable for the following components:      Result Value   APPearance HAZY (*)    Specific Gravity, Urine >1.046 (*)    Hgb urine dipstick LARGE (*)    Ketones, ur TRACE (*)    Protein, ur 30 (*)    All other components within normal limits  WET PREP, GENITAL  CBC WITH DIFFERENTIAL/PLATELET  PREGNANCY, URINE  GC/CHLAMYDIA PROBE AMP ([place]) NOT AT [facility]    EKG: None  Radiology: No results found.   Procedures   Medications Ordered in the ED  ketorolac  (TORADOL ) 30 MG/ML injection 30 mg (30 mg Intramuscular Given 11/16/24 0511)                                    Medical Decision Making Amount and/or Complexity of Data Reviewed Labs:  ordered.  Risk Prescription drug management.   This patient presents to the ED for concern of vaginal bleeding, this involves an extensive number of treatment options, and is a complaint that carries with it a high risk of complications and morbidity.  I considered the following differential and admission for this acute, potentially life threatening condition.  The differential diagnosis includes dysfunctional uterine bleeding, abnormal uterine bleeding, menorrhagia, menstrual period, pregnancy related complications such as miscarriage or ectopic  MDM:    This is a 19 year old female who presents with vaginal bleeding.  She is nontoxic.  Vital signs are reassuring.  Reports worsening cramping and delayed vaginal bleeding not consistent with her prior menses.  Pregnancy test is negative.  Pelvic exam is reassuring with some pooled blood in the vaginal vault but no clotting.  Hemoglobin is 12 and reassuring.  No significant lateralizing abdominal tenderness.  Low suspicion for ovarian cyst or torsion.  Overall reassuring.  Suspect abnormal uterine bleeding or menorrhagia.  Recommend NSAIDs.  Monitor bleeding closely.  (Labs, imaging, consults)  Labs: I Ordered, and personally interpreted labs.  The pertinent results include: CBC, wet prep, urinalysis, urine pregnancy  Imaging Studies ordered: I ordered imaging studies including N/A I independently visualized and interpreted imaging. I agree with the radiologist interpretation  Additional history obtained from chart review.  External records from outside source obtained and reviewed including prior evaluations  Cardiac Monitoring: The patient was not maintained on a cardiac monitor.  If on the cardiac monitor, I personally viewed and interpreted the cardiac monitored which showed an underlying rhythm of: N/A  Reevaluation: After the interventions noted above, I reevaluated the patient and found that they have :stayed the same  Social  Determinants of Health:  lives independently  Disposition: Discharge  Co morbidities that complicate the patient evaluation  Past Medical History:  Diagnosis Date   Exercise-induced asthma    no inhaler use in 2 yr.   History of febrile seizure    age 69   Tonsillar and adenoid hypertrophy 10/2017   snores during sleep, mother denies apnea     Medicines Meds ordered this encounter  Medications   ketorolac  (TORADOL ) 30 MG/ML injection 30 mg    I have reviewed the patients home medicines and have made adjustments as needed  Problem List / ED Course: Problem List Items Addressed This Visit   None Visit Diagnoses       Abnormal uterine bleeding    -  Primary  Final diagnoses:  Abnormal uterine bleeding    ED Discharge Orders     None          Bari Charmaine FALCON, MD 11/16/24 [phone]  "

## 2024-11-16 NOTE — Discharge Instructions (Signed)
 You were seen today for increased uterine bleeding and abdominal discomfort.  This could just be related to hormonal shifts and an abnormal period for you.  Take ibuprofen  as needed for pain.  Monitor pad count.  If you bleed more than a pad or tampon per hour, you should be reevaluated.

## 2024-11-18 LAB — GC/CHLAMYDIA PROBE AMP (~~LOC~~) NOT AT ARMC
Chlamydia: NEGATIVE
Comment: NEGATIVE
Comment: NORMAL
Neisseria Gonorrhea: NEGATIVE
